# Patient Record
Sex: Male | Born: 1975 | Hispanic: Refuse to answer | Marital: Married | Smoking: Former smoker
Health system: Southern US, Community
[De-identification: ages and names within clinical notes are randomized; demographics above are authoritative.]

## PROBLEM LIST (undated history)

## (undated) DIAGNOSIS — J45909 Unspecified asthma, uncomplicated: Secondary | ICD-10-CM

## (undated) DIAGNOSIS — K219 Gastro-esophageal reflux disease without esophagitis: Secondary | ICD-10-CM

## (undated) DIAGNOSIS — A159 Respiratory tuberculosis unspecified: Secondary | ICD-10-CM

## (undated) DIAGNOSIS — G4730 Sleep apnea, unspecified: Secondary | ICD-10-CM

## (undated) DIAGNOSIS — N529 Male erectile dysfunction, unspecified: Secondary | ICD-10-CM

## (undated) HISTORY — DX: Respiratory tuberculosis unspecified: A15.9

## (undated) HISTORY — DX: Male erectile dysfunction, unspecified: N52.9

## (undated) HISTORY — DX: Gastro-esophageal reflux disease without esophagitis: K21.9

---

## 1999-07-31 ENCOUNTER — Emergency Department (HOSPITAL_COMMUNITY): Admission: EM | Admit: 1999-07-31 | Discharge: 1999-07-31 | Payer: Self-pay | Admitting: Emergency Medicine

## 2002-02-25 ENCOUNTER — Emergency Department (HOSPITAL_COMMUNITY): Admission: EM | Admit: 2002-02-25 | Discharge: 2002-02-25 | Payer: Self-pay | Admitting: Emergency Medicine

## 2003-05-27 ENCOUNTER — Ambulatory Visit (HOSPITAL_BASED_OUTPATIENT_CLINIC_OR_DEPARTMENT_OTHER): Admission: RE | Admit: 2003-05-27 | Discharge: 2003-05-27 | Payer: Self-pay | Admitting: Family Medicine

## 2004-09-28 ENCOUNTER — Emergency Department (HOSPITAL_COMMUNITY): Admission: EM | Admit: 2004-09-28 | Discharge: 2004-09-28 | Payer: Self-pay | Admitting: Emergency Medicine

## 2006-03-04 ENCOUNTER — Emergency Department (HOSPITAL_COMMUNITY): Admission: EM | Admit: 2006-03-04 | Discharge: 2006-03-04 | Payer: Self-pay | Admitting: Emergency Medicine

## 2007-01-31 ENCOUNTER — Emergency Department (HOSPITAL_COMMUNITY): Admission: EM | Admit: 2007-01-31 | Discharge: 2007-01-31 | Payer: Self-pay | Admitting: Sports Medicine

## 2007-02-11 ENCOUNTER — Emergency Department (HOSPITAL_COMMUNITY): Admission: EM | Admit: 2007-02-11 | Discharge: 2007-02-11 | Payer: Self-pay | Admitting: Emergency Medicine

## 2013-10-04 ENCOUNTER — Encounter (HOSPITAL_COMMUNITY): Payer: Self-pay | Admitting: Emergency Medicine

## 2013-10-04 ENCOUNTER — Emergency Department (HOSPITAL_COMMUNITY)
Admission: EM | Admit: 2013-10-04 | Discharge: 2013-10-04 | Disposition: A | Discharge status: Home / Self Care | Payer: Managed Care, Other (non HMO) | Attending: Emergency Medicine | Admitting: Emergency Medicine

## 2013-10-04 DIAGNOSIS — H5710 Ocular pain, unspecified eye: Secondary | ICD-10-CM | POA: Diagnosis present

## 2013-10-04 DIAGNOSIS — H16002 Unspecified corneal ulcer, left eye: Secondary | ICD-10-CM

## 2013-10-04 DIAGNOSIS — H16009 Unspecified corneal ulcer, unspecified eye: Secondary | ICD-10-CM | POA: Insufficient documentation

## 2013-10-04 HISTORY — DX: Unspecified asthma, uncomplicated: J45.909

## 2013-10-04 HISTORY — DX: Sleep apnea, unspecified: G47.30

## 2013-10-04 MED ORDER — OFLOXACIN 0.3 % OP SOLN: 2.0000 [drp] | OPHTHALMIC | Status: DC

## 2013-10-04 MED ORDER — HYDROCODONE-ACETAMINOPHEN 5-325 MG PO TABS: 1.0000 | ORAL_TABLET | Freq: Four times a day (QID) | ORAL | Status: DC | PRN

## 2013-10-04 NOTE — ED Notes (Signed)
Declined W/C at D/C and was escorted to lobby by RN. 

## 2013-10-04 NOTE — ED Provider Notes (Signed)
CSN: [id number]     Arrival date & time 10/04/13  0941 History  This chart was scribed for non-physician practitioner Ebbie Ridge, PA-C working with Layla Maw Ward, DO by Leone Payor, ED Scribe. This patient was seen in room TR10C/TR10C and the patient's care was started at 10:23 AM.    No chief complaint on file.   The history is provided by the patient. No language interpreter was used.    HPI Comments: Kyle Patton is a 38 y.o. male who presents to the Emergency Department complaining of constant, gradually worsened left eye pain and redness with associated photophobia that began last night. Patient states he wears contact lenses and states he feels as though there is something in his eye even after removing the lens. He reports seeing a yellow spot on the left eye as well. He denies discharge, fever.     No past medical history on file. No past surgical history on file. No family history on file. History  Substance Use Topics  . Smoking status: Not on file  . Smokeless tobacco: Not on file  . Alcohol Use: Not on file    Review of Systems  Constitutional: Negative for fever.  Eyes: Positive for photophobia, pain and redness.      Allergies  Review of patient's allergies indicates not on file.  Home Medications   Prior to Admission medications   Not on File   There were no vitals taken for this visit. Physical Exam  Nursing note and vitals reviewed. Constitutional: He is oriented to person, place, and time. He appears well-developed and well-nourished.  HENT:  Head: Normocephalic and atraumatic.  Eyes: EOM are normal. Pupils are equal, round, and reactive to light. Left conjunctiva is injected. Left conjunctiva has no hemorrhage. Left eye exhibits normal extraocular motion.  Slit lamp exam:      The right eye shows no anterior chamber bulge.       The left eye shows corneal ulcer and fluorescein uptake. The left eye shows no corneal abrasion, no corneal flare, no  foreign body, no hyphema, no hypopyon and no anterior chamber bulge.    Pulmonary/Chest: Effort normal.  Neurological: He is alert and oriented to person, place, and time.  Skin: Skin is warm and dry.  Psychiatric: He has a normal mood and affect.    ED Course  Procedures (including critical care time)   COORDINATION OF CARE: 10:26 AM Discussed treatment plan with pt at bedside and pt agreed to plan.  Patient has a corneal ulcer noted to the 5 o'clock position on examination. I have advised him to follow up tomorrow with the opthalmologist. Patient agrees to the plan and all questions were answered.   I personally performed the services described in this documentation, which was scribed in my presence. The recorded information has been reviewed and is accurate.    Carlyle Dolly, PA-C 10/04/13 1042

## 2013-10-04 NOTE — ED Provider Notes (Signed)
Medical screening examination/treatment/procedure(s) were performed by non-physician practitioner and as supervising physician I was immediately available for consultation/collaboration.   EKG Interpretation None        Quiara Killian N Edris Friedt, DO 10/04/13 1050 

## 2013-10-04 NOTE — ED Notes (Signed)
Pt reports eye pain started Friday.

## 2013-10-04 NOTE — Discharge Instructions (Signed)
Call the eye doctor for thing in the morning for an appointment for tomorrow. Return here as needed.

## 2014-10-14 ENCOUNTER — Emergency Department (HOSPITAL_COMMUNITY)
Admission: EM | Admit: 2014-10-14 | Discharge: 2014-10-14 | Disposition: A | Discharge status: Home / Self Care | Payer: 59 | Attending: Emergency Medicine | Admitting: Emergency Medicine

## 2014-10-14 ENCOUNTER — Encounter (HOSPITAL_COMMUNITY): Payer: Self-pay | Admitting: *Deleted

## 2014-10-14 DIAGNOSIS — K30 Functional dyspepsia: Secondary | ICD-10-CM | POA: Insufficient documentation

## 2014-10-14 DIAGNOSIS — Z87891 Personal history of nicotine dependence: Secondary | ICD-10-CM | POA: Diagnosis not present

## 2014-10-14 DIAGNOSIS — R05 Cough: Secondary | ICD-10-CM | POA: Insufficient documentation

## 2014-10-14 DIAGNOSIS — R1310 Dysphagia, unspecified: Secondary | ICD-10-CM | POA: Diagnosis not present

## 2014-10-14 DIAGNOSIS — G4730 Sleep apnea, unspecified: Secondary | ICD-10-CM | POA: Insufficient documentation

## 2014-10-14 DIAGNOSIS — Z79899 Other long term (current) drug therapy: Secondary | ICD-10-CM | POA: Diagnosis not present

## 2014-10-14 DIAGNOSIS — J45909 Unspecified asthma, uncomplicated: Secondary | ICD-10-CM | POA: Diagnosis not present

## 2014-10-14 DIAGNOSIS — J029 Acute pharyngitis, unspecified: Secondary | ICD-10-CM | POA: Diagnosis present

## 2014-10-14 MED ORDER — OMEPRAZOLE 20 MG PO CPDR: 20.0000 mg | DELAYED_RELEASE_CAPSULE | Freq: Every day | ORAL | Status: DC

## 2014-10-14 NOTE — ED Notes (Signed)
Patient given crackers and sprite to drink.  Family at bedside.

## 2014-10-14 NOTE — Discharge Instructions (Signed)
-   Take omeprazole daily - Schedule follow up appointment with [facility] - Return to ED with fevers, difficulty breathing, difficulty swallowing liquids or saliva, choking or further worsening of symptoms  You are unable to swallow your own saliva . You are having shortness of breath or a fever, or both. You have a hoarse voice along with difficulty swallowing.

## 2014-10-14 NOTE — ED Provider Notes (Signed)
CSN: [id number]     Arrival date & time 10/14/14  4098 History   First MD Initiated Contact with Patient 10/14/14 [phone]     Chief Complaint  Patient presents with  . Sore Throat    HPI  Kyle Patton is a 39 year old male with PMHx of GERD and sleep apnea presenting today with sensation of food getting stuck in his throat. He states that when he eats he feels that his food gets stuck in his throat and he has a "thick" feeling. He is able to swallow fluids and solids easily without choking or gagging. Denies difficulty breathing or shortness of breath. He also describes a feeling of gas that gets stuck in his mid-chest when he tries to belch after eating. He is also complaining of "tickling" in his throat causing cough. These symptoms have been ongoing for around 4 months but have become more frequent  He has a history of GERD but has not taking PPIs or antacids for almost 2 years. Denies fevers, chills, sore throat, choking, chest pain, abdominal pain, nausea and vomiting.   Past Medical History  Diagnosis Date  . Asthma   . Sleep apnea     PT uses C=PAP   History reviewed. No pertinent past surgical history. No family history on file. Social History  Substance Use Topics  . Smoking status: Former Smoker    Quit date: 10/14/2007  . Smokeless tobacco: Never Used  . Alcohol Use: None     Comment: social    Review of Systems  Constitutional: Negative for fever and chills.  HENT: Positive for trouble swallowing. Negative for drooling and sore throat.   Respiratory: Positive for cough. Negative for apnea, choking, shortness of breath, wheezing and stridor.   Cardiovascular: Negative for chest pain and palpitations.  Gastrointestinal: Negative for nausea, vomiting and abdominal pain.  Musculoskeletal: Negative for neck pain.  Neurological: Negative for dizziness and headaches.      Allergies  Review of patient's allergies indicates no known allergies.  Home Medications   Prior to  Admission medications   Medication Sig Start Date End Date Taking? Authorizing Provider  Multiple Vitamins-Minerals (MULTIVITAMIN WITH MINERALS) tablet Take 1 tablet by mouth daily.   Yes Historical Provider, MD  omeprazole (PRILOSEC) 20 MG capsule Take 1 capsule (20 mg total) by mouth daily. 10/14/14   Alaycia Eardley, PA-C   BP 142/78 mmHg  Pulse 77  Temp(Src) 97.8 F (36.6 C) (Oral)  Resp 17  Ht 5' 6.25" (1.683 m)  Wt 217 lb (98.431 kg)  BMI 34.75 kg/m2  SpO2 97% Physical Exam  Constitutional: He is oriented to person, place, and time. He appears well-developed and well-nourished. No distress.  HENT:  Head: Normocephalic and atraumatic.  Mouth/Throat: Uvula is midline, oropharynx is clear and moist and mucous membranes are normal. No uvula swelling. No oropharyngeal exudate, posterior oropharyngeal edema or posterior oropharyngeal erythema.  No visible food stuck in oropharynx. No swelling of oropharynx or uvula. Airway patent.   Neck: Normal range of motion. Neck supple.  No tenderness over neck.  Cardiovascular: Normal rate, regular rhythm and normal heart sounds.   No murmur heard. Pulmonary/Chest: Effort normal and breath sounds normal. No stridor. No respiratory distress. He has no wheezes. He has no rales.  No increased work of breathing. Pt sitting comfortably with unlabored breaths.   Abdominal: Soft. He exhibits no distension. There is no tenderness. There is no rebound and no guarding.  Lymphadenopathy:    He has  no cervical adenopathy.  Neurological: He is alert and oriented to person, place, and time.  Skin: Skin is warm and dry.  Psychiatric: He has a normal mood and affect. His behavior is normal.  Nursing note and vitals reviewed.   ED Course  Procedures (including critical care time) Labs Review Labs Reviewed - No data to display  Imaging Review No results found. I have personally reviewed and evaluated these images and lab results as part of my medical  decision-making.   EKG Interpretation None      MDM   Final diagnoses:  Dysphagia  Indigestion   Pt presenting with feelings of food getting stuck in his throat, feeling of "gas getting caught in his chest" and cough. These symptoms have been present for 4 months but are getting more frequent. No difficulty swallowing solids or liquids. No trouble breathing or SOB. Pt has history of GERD but has been off PPI for 2 years. VSS and pt nontoxic. Throat is clear of food debri with no edema of uvula or oropharynx. Airway patent. No respiratory distress. Lungs CTAB. No stridor or wheezing. Will restart pt on PPI and give GI referral. Pt to call and schedule an appointment later today or tomorrow. Return precautions given in discharge paperwork and discussed with pt and wife present in the room. Pt agrees with this plan.    Alveta Heimlich, PA-C 10/14/14 1901  Lorre Nick, MD 10/15/14 0730

## 2014-10-14 NOTE — ED Notes (Signed)
Pt c/o "thick" feeling to epiglottis, off and on for several months, this time lasting 4 days.  Also experiences pain to both jaws when opening mouth and gagging after he eats.

## 2015-11-02 ENCOUNTER — Ambulatory Visit
Admission: RE | Admit: 2015-11-02 | Discharge: 2015-11-02 | Disposition: A | Discharge status: Home / Self Care | Payer: No Typology Code available for payment source | Source: Ambulatory Visit

## 2015-11-02 ENCOUNTER — Other Ambulatory Visit: Payer: Self-pay

## 2015-11-02 DIAGNOSIS — R7611 Nonspecific reaction to tuberculin skin test without active tuberculosis: Secondary | ICD-10-CM

## 2016-09-01 ENCOUNTER — Emergency Department (HOSPITAL_COMMUNITY): Payer: PRIVATE HEALTH INSURANCE

## 2016-09-01 ENCOUNTER — Encounter (HOSPITAL_COMMUNITY): Payer: Self-pay | Admitting: Emergency Medicine

## 2016-09-01 ENCOUNTER — Emergency Department (HOSPITAL_COMMUNITY)
Admission: EM | Admit: 2016-09-01 | Discharge: 2016-09-01 | Disposition: A | Discharge status: Home / Self Care | Payer: PRIVATE HEALTH INSURANCE | Attending: Emergency Medicine | Admitting: Emergency Medicine

## 2016-09-01 DIAGNOSIS — Y9389 Activity, other specified: Secondary | ICD-10-CM | POA: Insufficient documentation

## 2016-09-01 DIAGNOSIS — J45909 Unspecified asthma, uncomplicated: Secondary | ICD-10-CM | POA: Insufficient documentation

## 2016-09-01 DIAGNOSIS — M25551 Pain in right hip: Secondary | ICD-10-CM

## 2016-09-01 DIAGNOSIS — M7061 Trochanteric bursitis, right hip: Secondary | ICD-10-CM | POA: Insufficient documentation

## 2016-09-01 DIAGNOSIS — Z87891 Personal history of nicotine dependence: Secondary | ICD-10-CM | POA: Insufficient documentation

## 2016-09-01 MED ORDER — TRAMADOL HCL 50 MG PO TABS
100.0000 mg | ORAL_TABLET | Freq: Once | ORAL | Status: AC
  Administered 2016-09-01: 100 mg via ORAL
  Filled 2016-09-01: qty 2

## 2016-09-01 MED ORDER — ORPHENADRINE CITRATE ER 100 MG PO TB12: 100.0000 mg | ORAL_TABLET | Freq: Two times a day (BID) | ORAL | 0 refills | Status: DC

## 2016-09-01 MED ORDER — TRAMADOL HCL 50 MG PO TABS: 100.0000 mg | ORAL_TABLET | Freq: Four times a day (QID) | ORAL | 0 refills | Status: DC | PRN

## 2016-09-01 MED ORDER — PREDNISONE 20 MG PO TABS: ORAL_TABLET | ORAL | 0 refills | Status: DC

## 2016-09-01 MED ORDER — ACETAMINOPHEN 500 MG PO TABS
1000.0000 mg | ORAL_TABLET | Freq: Once | ORAL | Status: AC
  Administered 2016-09-01: 1000 mg via ORAL
  Filled 2016-09-01: qty 2

## 2016-09-01 NOTE — ED Provider Notes (Signed)
[facility]-EMERGENCY DEPT Provider Note   CSN: [id number] Arrival date & time: 09/01/16  0911     History   Chief Complaint Chief Complaint  Patient presents with  . Hip Pain    HPI Kyle Patton is a 41 y.o. male.  HPI Patient reports he has been getting right hip pain for about a week. It has been gradual in onset. He reports that is now very painful if he rotates or moves his hip a certain direction. The pain is right on the outside aspect of the right hip. No specific injury but he did start a new job that requires him to get in and out of a [employer] multiple times per day. No associated back pain. No associated lower extremity weakness numbness or tingling. He has been trying Aleve and ibuprofen with some relief. Past Medical History:  Diagnosis Date  . Asthma   . Sleep apnea    PT uses C=PAP    There are no active problems to display for this patient.   History reviewed. No pertinent surgical history.     Home Medications    Prior to Admission medications   Medication Sig Start Date End Date Taking? Authorizing Provider  acetaminophen (TYLENOL) 500 MG tablet Take 500 mg by mouth every 6 (six) hours as needed for mild pain.   Yes [provider]  naproxen sodium (ANAPROX) 220 MG tablet Take 220 mg by mouth daily as needed (pain).   Yes [provider]  omeprazole (PRILOSEC) 20 MG capsule Take 1 capsule (20 mg total) by mouth daily. Patient not taking: Reported on 09/01/2016 10/14/14   Barrett, Rolm GalaStevi, PA-C  orphenadrine (NORFLEX) 100 MG tablet Take 1 tablet (100 mg total) by mouth 2 (two) times daily. 09/01/16   Arby BarrettePfeiffer, Ornella Coderre, MD  predniSONE (DELTASONE) 20 MG tablet 3 tabs po daily x 3 days, then 2 tabs x 3 days, then 1.5 tabs x 3 days, then 1 tab x 3 days, then 0.5 tabs x 3 days 09/01/16   Arby BarrettePfeiffer, Jasraj Lappe, MD  traMADol (ULTRAM) 50 MG tablet Take 2 tablets (100 mg total) by mouth every 6 (six) hours as needed. 09/01/16   Arby BarrettePfeiffer, Kohner Orlick, MD    Family  History No family history on file.  Social History Social History  Substance Use Topics  . Smoking status: Former Smoker    Quit date: 10/14/2007  . Smokeless tobacco: Never Used  . Alcohol use Not on file     Comment: social     Allergies   Patient has no known allergies.   Review of Systems Review of Systems 10 Systems reviewed and are negative for acute change except as noted in the HPI.   Physical Exam Updated Vital Signs BP 130/82   Pulse 61   Temp 98.7 F (37.1 C) (Oral)   Resp 18   Ht 5\' 6"  (1.676 m)   Wt 90.7 kg (200 lb)   SpO2 98%   BMI 32.28 kg/m   Physical Exam  Constitutional: He appears well-developed and well-nourished.  HENT:  Head: Normocephalic and atraumatic.  Eyes: EOM are normal.  Neck: Neck supple.  Cardiovascular: Normal rate, regular rhythm and normal heart sounds.   No murmur heard. Pulmonary/Chest: Effort normal and breath sounds normal. No respiratory distress.  Abdominal: Soft. He exhibits no distension. There is no tenderness. There is no guarding.  Musculoskeletal: He exhibits tenderness. He exhibits no edema.  Low back is nontender to palpation. Buttocks and sciatic outlet nontender.  Neurological: He is alert.  Skin: Skin is warm and dry.  Psychiatric: He has a normal mood and affect.  Nursing note and vitals reviewed.    ED Treatments / Results  Labs (all labs ordered are listed, but only abnormal results are displayed) Labs Reviewed - No data to display  EKG  EKG Interpretation None       Radiology Dg Hip Unilat With Pelvis 2-3 Views Right  Result Date: 09/01/2016 CLINICAL DATA:  Right hip pain for 1 week. EXAM: DG HIP (WITH OR WITHOUT PELVIS) 2-3V RIGHT COMPARISON:  None. FINDINGS: Asymmetric calcification around the right greater trochanter. No fracture or malalignment. No erosive changes or degenerative joint narrowing/spurring. IMPRESSION: 1. Asymmetric calcification around the right greater trochanter which  could be calcific tendinitis or enthesophytes. 2. No acute osseous finding or degenerative joint narrowing. Electronically Signed   By: Marnee Spring M.D.   On: 09/01/2016 11:15    Procedures Procedures (including critical care time)  Medications Ordered in ED Medications  acetaminophen (TYLENOL) tablet 1,000 mg (1,000 mg Oral Given 09/01/16 1046)  traMADol (ULTRAM) tablet 100 mg (100 mg Oral Given 09/01/16 1046)     Initial Impression / Assessment and Plan / ED Course  I have reviewed the triage vital signs and the nursing notes.  Pertinent labs & imaging results that were available during my care of the patient were reviewed by me and considered in my medical decision making (see chart for details).      Final Clinical Impressions(s) / ED Diagnoses   Final diagnoses:  Right hip pain  Tendinitis in region of greater trochanter of femur, right  Patient has focal hip pain with calcific tendinitis identified on x-ray. This is likely overuse related as he has started a new job with recurrent in and out from a [place]. Will initiate prednisone taper, pain medications and muscle relaxers with instructions for follow-up and icing and rest.  New Prescriptions Discharge Medication List as of 09/01/2016 11:56 AM    START taking these medications   Details  orphenadrine (NORFLEX) 100 MG tablet Take 1 tablet (100 mg total) by mouth 2 (two) times daily., Starting Sat 09/01/2016, Print    predniSONE (DELTASONE) 20 MG tablet 3 tabs po daily x 3 days, then 2 tabs x 3 days, then 1.5 tabs x 3 days, then 1 tab x 3 days, then 0.5 tabs x 3 days, Print    traMADol (ULTRAM) 50 MG tablet Take 2 tablets (100 mg total) by mouth every 6 (six) hours as needed., Starting Sat 09/01/2016, Print         Arby Barrette, MD 09/01/16 1723

## 2016-09-01 NOTE — ED Triage Notes (Signed)
Patient presents from home with complaints of Right Hip pain x7. Patient reports pain has become increasing worst over the week. Patient reports taking naproxen and tylenol and reports helps. Patient denies any trauma to the hip reports he gets in and out of the work [employer]. Patient alert and oriented. Patient ambulatory

## 2017-03-13 ENCOUNTER — Emergency Department (HOSPITAL_COMMUNITY)
Admission: EM | Admit: 2017-03-13 | Discharge: 2017-03-13 | Disposition: A | Discharge status: Home / Self Care | Payer: BLUE CROSS/BLUE SHIELD | Attending: Emergency Medicine | Admitting: Emergency Medicine

## 2017-03-13 ENCOUNTER — Encounter (HOSPITAL_COMMUNITY): Payer: Self-pay | Admitting: *Deleted

## 2017-03-13 ENCOUNTER — Other Ambulatory Visit: Payer: Self-pay

## 2017-03-13 DIAGNOSIS — M546 Pain in thoracic spine: Secondary | ICD-10-CM | POA: Insufficient documentation

## 2017-03-13 DIAGNOSIS — G8929 Other chronic pain: Secondary | ICD-10-CM

## 2017-03-13 DIAGNOSIS — J45909 Unspecified asthma, uncomplicated: Secondary | ICD-10-CM | POA: Insufficient documentation

## 2017-03-13 DIAGNOSIS — Z87891 Personal history of nicotine dependence: Secondary | ICD-10-CM | POA: Insufficient documentation

## 2017-03-13 DIAGNOSIS — M7711 Lateral epicondylitis, right elbow: Secondary | ICD-10-CM

## 2017-03-13 NOTE — Discharge Instructions (Signed)
Please read attached information. If you experience any new or worsening signs or symptoms please return to the emergency room for evaluation. Please follow-up with your primary care provider or specialist as discussed.  °

## 2017-03-13 NOTE — ED Triage Notes (Signed)
Pt reports having right shoulder discomfort for several months. Reports recently having nerve pain to right elbow and forearm, states it increases with movement and gripping objects. Ambulatory at triage and no acute distress is noted.

## 2017-03-13 NOTE — ED Provider Notes (Signed)
[facility] EMERGENCY DEPARTMENT Provider Note   CSN: [id number] Arrival date & time: 03/13/17  1322     History   Chief Complaint Chief Complaint  Patient presents with  . Arm Pain    HPI Kyle Patton is a 42 y.o. male.  HPI   42 year old male presents today with complaints of arm pain.  Patient notes 2 weeks of right lateral elbow pain.  He notes pain is worsened with flexion or extension of the elbow, also with pronation and supination of the forearm.  He reports that he is currently not working, does not exercise, does not do any repetitive activities.  He denies any trauma or swelling to the area.  No loss of distal sensation strength or motor function.  Patient also notes intermittent upper thoracic back pain, worse after waking up in the morning, no distal neurological deficits.  Patient denies any red flags for back pain.   Past Medical History:  Diagnosis Date  . Asthma   . Sleep apnea    PT uses C=PAP    There are no active problems to display for this patient.   History reviewed. No pertinent surgical history.     Home Medications    Prior to Admission medications   Medication Sig Start Date End Date Taking? Authorizing Provider  acetaminophen (TYLENOL) 500 MG tablet Take 500 mg by mouth every 6 (six) hours as needed for mild pain.   Yes [provider]  naproxen sodium (ANAPROX) 220 MG tablet Take 220 mg by mouth daily as needed (pain).   Yes [provider]  omeprazole (PRILOSEC) 20 MG capsule Take 1 capsule (20 mg total) by mouth daily. Patient taking differently: Take 20 mg by mouth daily as needed (reflux).  10/14/14  Yes Barrett, Rolm Gala, PA-C  orphenadrine (NORFLEX) 100 MG tablet Take 1 tablet (100 mg total) by mouth 2 (two) times daily. Patient not taking: Reported on 03/13/2017 09/01/16   Arby Barrette, MD  traMADol (ULTRAM) 50 MG tablet Take 2 tablets (100 mg total) by mouth every 6 (six) hours as  needed. Patient not taking: Reported on 03/13/2017 09/01/16   Arby Barrette, MD    Family History History reviewed. No pertinent family history.  Social History Social History   Tobacco Use  . Smoking status: Former Smoker    Last attempt to quit: 10/14/2007    Years since quitting: 9.4  . Smokeless tobacco: Never Used  Substance Use Topics  . Alcohol use: Not on file    Comment: social  . Drug use: No     Allergies   Patient has no known allergies.   Review of Systems Review of Systems  All other systems reviewed and are negative.   Physical Exam Updated Vital Signs BP 132/80 (BP Location: Left Arm)   Pulse 75   Temp 98.7 F (37.1 C) (Oral)   Resp 20   SpO2 97%   Physical Exam  Constitutional: He is oriented to person, place, and time. He appears well-developed and well-nourished.  HENT:  Head: Normocephalic and atraumatic.  Eyes: Conjunctivae are normal. Pupils are equal, round, and reactive to light. Right eye exhibits no discharge. Left eye exhibits no discharge. No scleral icterus.  Neck: Normal range of motion. No JVD present. No tracheal deviation present.  Pulmonary/Chest: Effort normal. No stridor.  Musculoskeletal:  Right upper extremity atraumatic no swelling or edema, tenderness palpation of the lateral epicondyle of the humerus.  Pain with pronation supination of the  forearm and wrist, pain with extension of the elbow-grip strength 5 out of 5 sensation intact radial pulse 2+  No CT or L-spine tenderness, musculature of back nontender no swelling or edema  Neurological: He is alert and oriented to person, place, and time. Coordination normal.  Psychiatric: He has a normal mood and affect. His behavior is normal. Judgment and thought content normal.  Nursing note and vitals reviewed.    ED Treatments / Results  Labs (all labs ordered are listed, but only abnormal results are displayed) Labs Reviewed - No data to display  EKG  EKG  Interpretation None       Radiology No results found.  Procedures Procedures (including critical care time)  Medications Ordered in ED Medications - No data to display   Initial Impression / Assessment and Plan / ED Course  I have reviewed the triage vital signs and the nursing notes.  Pertinent labs & imaging results that were available during my care of the patient were reviewed by me and considered in my medical decision making (see chart for details).     Final Clinical Impressions(s) / ED Diagnoses   Final diagnoses:  Lateral epicondylitis of right elbow  Chronic midline thoracic back pain   Labs:   Imaging:  Consults:  Therapeutics:  Discharge Meds:   Assessment/Plan: 42 year old male presents today with likely right lateral epicondylitis.  Uncertain etiology at this time as patient does not do any significant physical activity or repetitive behaviors.  Patient will be instructed to rest ice compress and avoid any significant physical activity.  I have encouraged him to follow-up as an outpatient with his primary care if symptoms persist.  No red flags for back pain back exercises given.  He is given strict return precautions, he verbalized understanding and agreement to today's plan had no further questions or concerns.    ED Discharge Orders    None       Eyvonne MechanicHedges, Loza Prell, PA-C 03/13/17 1715    Rolan BuccoBelfi, Melanie, MD 03/15/17 [phone]

## 2017-07-30 ENCOUNTER — Other Ambulatory Visit: Payer: Self-pay

## 2017-07-30 ENCOUNTER — Emergency Department (HOSPITAL_COMMUNITY)
Admission: EM | Admit: 2017-07-30 | Discharge: 2017-07-30 | Disposition: A | Discharge status: Home / Self Care | Payer: BLUE CROSS/BLUE SHIELD | Attending: Emergency Medicine | Admitting: Emergency Medicine

## 2017-07-30 ENCOUNTER — Encounter (HOSPITAL_COMMUNITY): Payer: Self-pay | Admitting: Emergency Medicine

## 2017-07-30 DIAGNOSIS — Z87891 Personal history of nicotine dependence: Secondary | ICD-10-CM | POA: Diagnosis not present

## 2017-07-30 DIAGNOSIS — G44209 Tension-type headache, unspecified, not intractable: Secondary | ICD-10-CM | POA: Diagnosis not present

## 2017-07-30 DIAGNOSIS — J45909 Unspecified asthma, uncomplicated: Secondary | ICD-10-CM | POA: Diagnosis not present

## 2017-07-30 DIAGNOSIS — G44219 Episodic tension-type headache, not intractable: Secondary | ICD-10-CM | POA: Insufficient documentation

## 2017-07-30 DIAGNOSIS — R51 Headache: Secondary | ICD-10-CM | POA: Diagnosis not present

## 2017-07-30 LAB — CBG MONITORING, ED: GLUCOSE-CAPILLARY: 84 mg/dL (ref 65–99)

## 2017-07-30 MED ORDER — DIPHENHYDRAMINE HCL 25 MG PO CAPS
25.0000 mg | ORAL_CAPSULE | Freq: Once | ORAL | Status: AC
  Administered 2017-07-30: 25 mg via ORAL
  Filled 2017-07-30: qty 1

## 2017-07-30 MED ORDER — METOCLOPRAMIDE HCL 5 MG/ML IJ SOLN: 5.0000 mg | Freq: Once | INTRAMUSCULAR | Status: DC

## 2017-07-30 MED ORDER — METOCLOPRAMIDE HCL 10 MG PO TABS
5.0000 mg | ORAL_TABLET | Freq: Once | ORAL | Status: DC
  Filled 2017-07-30: qty 1

## 2017-07-30 MED ORDER — DIPHENHYDRAMINE HCL 50 MG/ML IJ SOLN: 25.0000 mg | Freq: Once | INTRAMUSCULAR | Status: DC

## 2017-07-30 MED ORDER — KETOROLAC TROMETHAMINE 30 MG/ML IJ SOLN
30.0000 mg | Freq: Once | INTRAMUSCULAR | Status: DC
  Filled 2017-07-30: qty 1

## 2017-07-30 NOTE — Discharge Instructions (Addendum)
Take over-the-counter anti-inflammatories for pain as needed. Call your primary care physician to schedule follow-up as planned.  Contact a health care provider if: Your symptoms are not helped by medicine. You have a headache that is different from the usual headache. You have nausea or you vomit. You have a fever. Get help right away if: Your headache becomes severe. You have repeated vomiting. You have a stiff neck. You have a loss of vision. You have problems with speech. You have pain in the eye or ear. You have muscular weakness or loss of muscle control. You lose your balance or have trouble walking. You feel faint or pass out. You have confusion.

## 2017-07-30 NOTE — ED Provider Notes (Signed)
[facility] EMERGENCY DEPARTMENT Provider Note   CSN: [id number] Arrival date & time: 07/30/17  1712     History   Chief Complaint Chief Complaint  Patient presents with  . Headache    HPI Kyle Patton is a 42 y.o. male presenting for 3 days of headache.  Patient states that his headache is an intermittent throbbing pain that occurs approximately 3-4 times a day and lasts about 1 hour.  Patient states that pain is worsened by movement and light.  Patient has tried Sudafed and Motrin for pain with no relief.  Patient states that pain is 5/10 in severity.  Patient states that he gets headaches often and this feels like his normal headache.  Patient states he is concerned about his blood sugar and blood pressure since he has a family history of diabetes and hypertension and that this is what brought him in today.  Patient has primary care provider but states that he has not seen him this year.  HPI  Past Medical History:  Diagnosis Date  . Asthma   . Sleep apnea    PT uses C=PAP    There are no active problems to display for this patient.   History reviewed. No pertinent surgical history.      Home Medications    Prior to Admission medications   Medication Sig Start Date End Date Taking? Authorizing Provider  acetaminophen (TYLENOL) 500 MG tablet Take 500 mg by mouth every 6 (six) hours as needed for mild pain.    [provider]  naproxen sodium (ANAPROX) 220 MG tablet Take 220 mg by mouth daily as needed (pain).    [provider]  omeprazole (PRILOSEC) 20 MG capsule Take 1 capsule (20 mg total) by mouth daily. Patient taking differently: Take 20 mg by mouth daily as needed (reflux).  10/14/14   Barrett, Rolm Gala, PA-C  orphenadrine (NORFLEX) 100 MG tablet Take 1 tablet (100 mg total) by mouth 2 (two) times daily. Patient not taking: Reported on 03/13/2017 09/01/16   Arby Barrette, MD  traMADol (ULTRAM) 50 MG tablet Take 2 tablets (100  mg total) by mouth every 6 (six) hours as needed. Patient not taking: Reported on 03/13/2017 09/01/16   Arby Barrette, MD    Family History No family history on file.  Social History Social History   Tobacco Use  . Smoking status: Former Smoker    Last attempt to quit: 10/14/2007    Years since quitting: 9.8  . Smokeless tobacco: Never Used  Substance Use Topics  . Alcohol use: Not on file    Comment: social  . Drug use: No     Allergies   Patient has no known allergies.   Review of Systems Review of Systems  Constitutional: Negative.  Negative for chills, fatigue and fever.  HENT: Positive for sinus pressure. Negative for congestion, ear pain, rhinorrhea, sore throat and trouble swallowing.   Eyes: Positive for photophobia. Negative for pain, redness and visual disturbance.  Respiratory: Negative.  Negative for cough, chest tightness, shortness of breath and wheezing.   Cardiovascular: Negative.  Negative for chest pain and leg swelling.  Gastrointestinal: Negative.  Negative for abdominal pain, blood in stool, diarrhea, nausea and vomiting.  Genitourinary: Negative.  Negative for dysuria, flank pain, hematuria, scrotal swelling and testicular pain.  Musculoskeletal: Negative.  Negative for arthralgias, myalgias and neck pain.  Skin: Negative.  Negative for rash.  Neurological: Positive for headaches. Negative for dizziness, syncope, speech difficulty, weakness,  light-headedness and numbness.     Physical Exam Updated Vital Signs BP (!) 148/95 (BP Location: Right Arm)   Pulse 62   Temp 98.5 F (36.9 C) (Oral)   Resp 18   Ht 5\' 6"  (1.676 m)   Wt 98.4 kg (217 lb)   SpO2 99%   BMI 35.02 kg/m   Physical Exam  Constitutional: He is oriented to person, place, and time. He appears well-developed and well-nourished.  Non-toxic appearance. He does not appear ill. No distress.  HENT:  Head: Normocephalic and atraumatic.  Mouth/Throat: Oropharynx is clear and moist.    Eyes: Pupils are equal, round, and reactive to light. EOM are normal. Pupils are equal.  Neck: Trachea normal, normal range of motion, full passive range of motion without pain and phonation normal. Neck supple. No muscular tenderness present. No neck rigidity. No tracheal deviation and normal range of motion present.  Cardiovascular: Normal rate, regular rhythm, normal heart sounds and intact distal pulses. Exam reveals no gallop and no friction rub.  No murmur heard. Pulmonary/Chest: Effort normal and breath sounds normal. No respiratory distress.  Abdominal: Soft. Bowel sounds are normal. There is no tenderness. There is no guarding.  Neurological: He is alert and oriented to person, place, and time. He has normal strength. No cranial nerve deficit. Coordination and gait normal.  Skin: Skin is warm and dry.  Psychiatric: He has a normal mood and affect. His behavior is normal.     ED Treatments / Results  Labs (all labs ordered are listed, but only abnormal results are displayed) Labs Reviewed  CBG MONITORING, ED    EKG None  Radiology No results found.  Procedures Procedures (including critical care time)  Medications Ordered in ED Medications  ketorolac (TORADOL) 30 MG/ML injection 30 mg (30 mg Intramuscular Refused 07/30/17 2022)  metoCLOPramide (REGLAN) tablet 5 mg (5 mg Oral Refused 07/30/17 2022)  diphenhydrAMINE (BENADRYL) capsule 25 mg (25 mg Oral Given 07/30/17 2022)     Initial Impression / Assessment and Plan / ED Course  I have reviewed the triage vital signs and the nursing notes.  Pertinent labs & imaging results that were available during my care of the patient were reviewed by me and considered in my medical decision making (see chart for details).  Clinical Course as of Jul 31 2150  Tue Jul 30, 2017  1946 Patient and visitor requesting handout for Toradol prior to administration.  Handout given and risks and benefits of Toradol injection discussed at  length with patient and family.  Patient states that he has no history of kidney disease or GI bleeding.   [BM]  2144 Patient refused Reglan and Toradol due to side effects that they read on Internet and and handout.  Patient states that he is feeling better now and requesting discharge.   [BM]  2144 Benadryl states he is feeling much better at this time.   [BM]    Clinical Course User Index [BM] Elizabeth Palau    Kyle Patton is a 42 y.o. male who presents to ED for 3 days of intermittent headache.  Patient states that this headache feels like his normal headache and that he is concerned about his blood sugar.  Patient has no history of diabetes.  No focal neuro deficits on exam.  Patient refused Toradol and Reglan today stating concerns over side effects.  Patient did take his Benadryl in department.  Patient states that his pain has now decreased to a 2.5/10  and is now requesting discharge.  On re-evaluation, patient is sitting comfortably in arm chair and asking to be discharged. The patient denies any neurologic symptoms such as visual changes, focal numbness/weakness, balance problems, confusion, or speech difficulty to suggest a life-threatening intracranial process such as intracranial hemorrhage or mass. The patient has no clotting risk factors thus venous sinus thrombosis is unlikely. No fevers, neck pain or nuchal rigidity to suggest meningitis. I feel that the patient is safe for discharge home at this time.  Patient states that he will call his primary care provider in the morning.  I have reviewed return precautions including development of neurologic symptoms, confusion, lethargy, difficulty speaking, or new/worsening/concerning symptoms.  Patient informed that he can take over-the-counter anti-inflammatory medications for his pain.  Patient states that he will follow-up with primary care provider in the morning.  Patient states understanding of return precautions.  Patient  given physical copy of return precautions in the after visit summary.  All questions answered.  Final Clinical Impressions(s) / ED Diagnoses   Final diagnoses:  Episodic tension-type headache, not intractable    ED Discharge Orders    None       Elizabeth PalauMorelli, Tazaria Dlugosz A, PA-C 07/30/17 2155    Tilden Fossaees, Elizabeth, MD 07/31/17 1317

## 2017-07-30 NOTE — ED Notes (Signed)
Pt alert and oriented in NAD. Pt verbalized understanding of discharge instructions. 

## 2017-07-30 NOTE — ED Triage Notes (Signed)
Pt complains of sinus pressure and headache off and on for two weeks. Denies N/V/diarrhea. Pt also complains of nonproductive cough.

## 2018-01-13 ENCOUNTER — Telehealth: Payer: Self-pay | Admitting: Family Medicine

## 2018-01-13 NOTE — Telephone Encounter (Signed)
Received requested records from [facility] [place]. Sending back for review.  °

## 2018-01-31 ENCOUNTER — Encounter: Payer: Self-pay | Admitting: Family Medicine

## 2018-01-31 ENCOUNTER — Ambulatory Visit: Payer: BLUE CROSS/BLUE SHIELD | Admitting: Family Medicine

## 2018-01-31 VITALS — BP 140/90 | HR 68 | Ht 66.75 in | Wt 232.2 lb

## 2018-01-31 DIAGNOSIS — Z113 Encounter for screening for infections with a predominantly sexual mode of transmission: Secondary | ICD-10-CM | POA: Diagnosis not present

## 2018-01-31 DIAGNOSIS — E669 Obesity, unspecified: Secondary | ICD-10-CM | POA: Diagnosis not present

## 2018-01-31 DIAGNOSIS — R6882 Decreased libido: Secondary | ICD-10-CM

## 2018-01-31 DIAGNOSIS — G4733 Obstructive sleep apnea (adult) (pediatric): Secondary | ICD-10-CM | POA: Diagnosis not present

## 2018-01-31 DIAGNOSIS — Z0000 Encounter for general adult medical examination without abnormal findings: Secondary | ICD-10-CM | POA: Diagnosis not present

## 2018-01-31 DIAGNOSIS — Z23 Encounter for immunization: Secondary | ICD-10-CM | POA: Diagnosis not present

## 2018-01-31 DIAGNOSIS — K219 Gastro-esophageal reflux disease without esophagitis: Secondary | ICD-10-CM | POA: Diagnosis not present

## 2018-01-31 DIAGNOSIS — A159 Respiratory tuberculosis unspecified: Secondary | ICD-10-CM | POA: Insufficient documentation

## 2018-01-31 DIAGNOSIS — R5383 Other fatigue: Secondary | ICD-10-CM

## 2018-01-31 DIAGNOSIS — Z13220 Encounter for screening for lipoid disorders: Secondary | ICD-10-CM

## 2018-01-31 DIAGNOSIS — Z833 Family history of diabetes mellitus: Secondary | ICD-10-CM | POA: Insufficient documentation

## 2018-01-31 DIAGNOSIS — G4730 Sleep apnea, unspecified: Secondary | ICD-10-CM | POA: Insufficient documentation

## 2018-01-31 HISTORY — DX: Obesity, unspecified: E66.9

## 2018-01-31 LAB — POCT URINALYSIS DIP (PROADVANTAGE DEVICE)
Bilirubin, UA: NEGATIVE
GLUCOSE UA: NEGATIVE mg/dL
Ketones, POC UA: NEGATIVE mg/dL
Leukocytes, UA: NEGATIVE
NITRITE UA: NEGATIVE
PH UA: 6.5 (ref 5.0–8.0)
PROTEIN UA: NEGATIVE mg/dL
RBC UA: NEGATIVE
SPECIFIC GRAVITY, URINE: 1.015
UUROB: NEGATIVE

## 2018-01-31 NOTE — Patient Instructions (Signed)
You will receive a call to schedule the sleep study.   I recommend you get a dental cleaning.   Make sure you are cutting back on foods high in sodium. Do not sit for long periods, the calf muscle is the pump to get rid of lower edema swelling.   We will call you with your lab results.      Preventative Care for Adults, Male       REGULAR HEALTH EXAMS:  A routine yearly physical is a good way to check in with your primary care provider about your health and preventive screening. It is also an opportunity to share updates about your health and any concerns you have, and receive a thorough all-over exam.   Most health insurance companies pay for at least some preventative services.  Check with your health plan for specific coverages.  WHAT PREVENTATIVE SERVICES DO MEN NEED?  Adult men should have their weight and blood pressure checked regularly.   Men age 43 and older should have their cholesterol levels checked regularly.  Beginning at age 43 and continuing to age 43, men should be screened for colorectal cancer.  Certain people should may need continued testing until age 43.  Other cancer screening may include exams for testicular and prostate cancer.  Updating vaccinations is part of preventative care.  Vaccinations help protect against diseases such as the flu.  Lab tests are generally done as part of preventative care to screen for anemia and blood disorders, to screen for problems with the kidneys and liver, to screen for bladder problems, to check blood sugar, and to check your cholesterol level.  Preventative services generally include counseling about diet, exercise, avoiding tobacco, drugs, excessive alcohol consumption, and sexually transmitted infections.    GENERAL RECOMMENDATIONS FOR GOOD HEALTH:  Healthy diet:  Eat a variety of foods, including fruit, vegetables, animal or vegetable protein, such as meat, fish, chicken, and eggs, or beans, lentils, tofu, and  grains, such as rice.  Drink plenty of water daily.  Decrease saturated fat in the diet, avoid lots of red meat, processed foods, sweets, fast foods, and fried foods.  Exercise:  Aerobic exercise helps maintain good heart health. At least 30-40 minutes of moderate-intensity exercise is recommended. For example, a brisk walk that increases your heart rate and breathing. This should be done on most days of the week.   Find a type of exercise or a variety of exercises that you enjoy so that it becomes a part of your daily life.  Examples are running, walking, swimming, water aerobics, and biking.  For motivation and support, explore group exercise such as aerobic class, spin class, Zumba, Yoga,or  martial arts, etc.    Set exercise goals for yourself, such as a certain weight goal, walk or run in a race such as a 5k walk/run.  Speak to your primary care provider about exercise goals.  Disease prevention:  If you smoke or chew tobacco, find out from your caregiver how to quit. It can literally save your life, no matter how long you have been a tobacco user. If you do not use tobacco, never begin.   Maintain a healthy diet and normal weight. Increased weight leads to problems with blood pressure and diabetes.   The Body Mass Index or BMI is a way of measuring how much of your body is fat. Having a BMI above 27 increases the risk of heart disease, diabetes, hypertension, stroke and other problems related to obesity. Your caregiver  can help determine your BMI and based on it develop an exercise and dietary program to help you achieve or maintain this important measurement at a healthful level.  High blood pressure causes heart and blood vessel problems.  Persistent high blood pressure should be treated with medicine if weight loss and exercise do not work.   Fat and cholesterol leaves deposits in your arteries that can block them. This causes heart disease and vessel disease elsewhere in your body.   If your cholesterol is found to be high, or if you have heart disease or certain other medical conditions, then you may need to have your cholesterol monitored frequently and be treated with medication.   Ask if you should have a stress test if your history suggests this. A stress test is a test done on a treadmill that looks for heart disease. This test can find disease prior to there being a problem.  Avoid drinking alcohol in excess (more than two drinks per day).  Avoid use of street drugs. Do not share needles with anyone. Ask for professional help if you need assistance or instructions on stopping the use of alcohol, cigarettes, and/or drugs.  Brush your teeth twice a day with fluoride toothpaste, and floss once a day. Good oral hygiene prevents tooth decay and gum disease. The problems can be painful, unattractive, and can cause other health problems. Visit your dentist for a routine oral and dental check up and preventive care every 6-12 months.   Look at your skin regularly.  Use a mirror to look at your back. Notify your caregivers of changes in moles, especially if there are changes in shapes, colors, a size larger than a pencil eraser, an irregular border, or development of new moles.  Safety:  Use seatbelts 100% of the time, whether driving or as a passenger.  Use safety devices such as hearing protection if you work in environments with loud noise or significant background noise.  Use safety glasses when doing any work that could send debris in to the eyes.  Use a helmet if you ride a bike or motorcycle.  Use appropriate safety gear for contact sports.  Talk to your caregiver about gun safety.  Use sunscreen with a SPF (or skin protection factor) of 15 or greater.  Lighter skinned people are at a greater risk of skin cancer. Don't forget to also wear sunglasses in order to protect your eyes from too much damaging sunlight. Damaging sunlight can accelerate cataract formation.   Practice  safe sex. Use condoms. Condoms are used for birth control and to help reduce the spread of sexually transmitted infections (or STIs).  Some of the STIs are gonorrhea (the clap), chlamydia, syphilis, trichomonas, herpes, HPV (human papilloma virus) and HIV (human immunodeficiency virus) which causes AIDS. The herpes, HIV and HPV are viral illnesses that have no cure. These can result in disability, cancer and death.   Keep carbon monoxide and smoke detectors in your home functioning at all times. Change the batteries every 6 months or use a model that plugs into the wall.   Vaccinations:  Stay up to date with your tetanus shots and other required immunizations. You should have a booster for tetanus every 10 years. Be sure to get your flu shot every year, since 5%-20% of the [place] population comes down with the flu. The flu vaccine changes each year, so being vaccinated once is not enough. Get your shot in the fall, before the flu season peaks.  Other vaccines to consider:  Pneumococcal vaccine to protect against certain types of pneumonia.  This is normally recommended for adults age 35 or older.  However, adults younger than 43 years old with certain underlying conditions such as diabetes, heart or lung disease should also receive the vaccine.  Shingles vaccine to protect against Varicella Zoster if you are older than age 42, or younger than 43 years old with certain underlying illness.  Hepatitis A vaccine to protect against a form of infection of the liver by a virus acquired from food.  Hepatitis B vaccine to protect against a form of infection of the liver by a virus acquired from blood or body fluids, particularly if you work in health care.  If you plan to travel internationally, check with your local health department for specific vaccination recommendations.  Cancer Screening:  Most routine colon cancer screening begins at the age of 70. On a yearly basis, doctors may provide special  easy to use take-home tests to check for hidden blood in the stool. Sigmoidoscopy or colonoscopy can detect the earliest forms of colon cancer and is life saving. These tests use a small camera at the end of a tube to directly examine the colon. Speak to your caregiver about this at age 75, when routine screening begins (and is repeated every 5 years unless early forms of pre-cancerous polyps or small growths are found).   At the age of 23 men usually start screening for prostate cancer every year. Screening may begin at a younger age for those with higher risk. Those at higher risk include African-Americans or having a family history of prostate cancer. There are two types of tests for prostate cancer:   Prostate-specific antigen (PSA) testing. Recent studies raise questions about prostate cancer using PSA and you should discuss this with your caregiver.   Digital rectal exam (in which your doctor's lubricated and gloved finger feels for enlargement of the prostate through the anus).   Screening for testicular cancer.  Do a monthly exam of your testicles. Gently roll each testicle between your thumb and fingers, feeling for any abnormal lumps. The best time to do this is after a hot shower or bath when the tissues are looser. Notify your caregivers of any lumps, tenderness or changes in size or shape immediately.

## 2018-01-31 NOTE — Progress Notes (Signed)
Subjective:    Patient ID: Kyle Patton, male    DOB: [birth year], 42 y.o.   MRN: [id number]  HPI Chief Complaint  Patient presents with  . new pt    new pt cpe, acid reflux, got eye checked recently. declines flu shot. when getting up, sometimes having like panic attack.- coughing spell - has a hx of sleep apnea    He is new to the practice and here for a complete physical exam. Previous medical care: [facility]. Dr. Nehemiah SettlePolite. Last there 2 years ago.  Last CPE: 2 years ago   Other providers: None   Asthma as a child. No longer an issue.  Sleep Apnea- diagnosed in 2008. Sleep study done. Does not use his CPAP. Needs a new sleep study.   GERD- managing with lifestyle and trigger avoidance.  Complain of fatigue and decreased for several months. Requests testosterone check.   Social history: Lives with his wife and 2 kids, works as a Sports coachcase manager at [employer]  Former smoker. Stopped 10 years ago. drinkins alcohol, no drug use Diet: fried foods. Fast food 3 times per week.  Drinks green tea. Stopped drinking coffee.  Exercise: sporadic  Immunizations: flu shot declined. Tdap   Health maintenance:  Colonoscopy: N/A  Last PSA: N/A Last Dental Exam: 3 years ago  Last Eye Exam: a week ago   Wears seatbelt always, smoke detectors in home and functioning, does not text while driving, feels safe in home environment.  Reviewed allergies, medications, past medical, surgical, family, and social history.    Review of Systems Review of Systems Constitutional: -fever, -chills, -sweats, -unexpected weight change,+fatigue ENT: -runny nose, -ear pain, -sore throat Cardiology:  -chest pain, -palpitations, -edema Respiratory: -cough, -shortness of breath, -wheezing Gastroenterology: -abdominal pain, -nausea, -vomiting, -diarrhea, -constipation  Hematology: -bleeding or bruising problems Musculoskeletal: -arthralgias, -myalgias, -joint swelling, -back pain Ophthalmology:  -vision changes Urology: -dysuria, -difficulty urinating, -hematuria, -urinary frequency, -urgency Neurology: -headache, -weakness, -tingling, -numbness       Objective:   Physical Exam BP 140/90   Pulse 68   Ht 5' 6.75" (1.695 m)   Wt 232 lb 3.2 oz (105.3 kg)   BMI 36.64 kg/m   General Appearance:    Alert, cooperative, no distress, appears stated age  Head:    Normocephalic, without obvious abnormality, atraumatic  Eyes:    PERRL, conjunctiva/corneas clear, EOM's intact, fundi    benign  Ears:    Normal TM's and external ear canals  Nose:   Nares normal, mucosa normal, no drainage or sinus   tenderness  Throat:   Lips, mucosa, and tongue normal; teeth and gums normal  Neck:   Supple, no lymphadenopathy;  thyroid:  no   enlargement/tenderness/nodules; no carotid   bruit or JVD  Back:    Spine nontender, no curvature, ROM normal, no CVA     tenderness  Lungs:     Clear to auscultation bilaterally without wheezes, rales or     ronchi; respirations unlabored  Chest Wall:    No tenderness or deformity   Heart:    Regular rate and rhythm, S1 and S2 normal, no murmur, rub   or gallop  Breast Exam:    No chest wall tenderness, masses or gynecomastia  Abdomen:     Soft, non-tender, nondistended, normoactive bowel sounds,    no masses, no hepatosplenomegaly  Genitalia:    Normal male external genitalia without lesions.  Testicles without masses.  No inguinal hernias.  Extremities:   No clubbing, cyanosis or edema  Pulses:   2+ and symmetric all extremities  Skin:   Skin color, texture, turgor normal, no rashes or lesions  Lymph nodes:   Cervical, supraclavicular, and axillary nodes normal  Neurologic:   CNII-XII intact, normal strength, sensation and gait; reflexes 2+ and symmetric throughout          Psych:   Normal mood, affect, hygiene and grooming.     Urinalysis dipstick: negative       Assessment & Plan:  Routine general medical examination at a health care facility -  Plan: POCT Urinalysis DIP (Proadvantage Device), CBC with Differential/Platelet, Comprehensive metabolic panel, TSH, T4, free, Lipid panel  Obesity (BMI 30-39.9) - Plan: Hemoglobin A1c, Lipid panel  Obstructive sleep apnea syndrome - Plan: Home sleep test  Gastroesophageal reflux disease, esophagitis presence not specified  TB (tuberculosis), treated  Family history of diabetes mellitus in brother - Plan: Hemoglobin A1c  Decreased libido - Plan: Testosterone  Fatigue, unspecified type - Plan: Testosterone, Home sleep test  Screening for lipid disorders - Plan: Lipid panel  Need for diphtheria-tetanus-pertussis (Tdap) vaccine - Plan: Tdap vaccine greater than or equal to 7yo IM  Screen for STD (sexually transmitted disease) - Plan: RPR, GC/Chlamydia Probe Amp, HIV Antibody (routine testing w rflx)  Is a 42 year old male patient who is new to the practice and here for a fasting CPE. Discussed multiple etiologies for fatigue and will check labs. Counseling on healthy diet and exercise for weight and to improve overall health. He would like STD testing. Tdap given and counseling done on all components of the vaccine. History of sleep apnea and has not been using his CPAP.  He will need a new sleep study.  Discussed potential long-term consequences of untreated sleep apnea.  Also discussed this is very possible etiology for fatigue Follow-up pending labs

## 2018-02-01 LAB — CBC WITH DIFFERENTIAL/PLATELET
Basophils Absolute: 0 10*3/uL (ref 0.0–0.2)
Basos: 1 %
EOS (ABSOLUTE): 0.1 10*3/uL (ref 0.0–0.4)
EOS: 2 %
HEMATOCRIT: 44.3 % (ref 37.5–51.0)
Hemoglobin: 14.9 g/dL (ref 13.0–17.7)
Immature Grans (Abs): 0 10*3/uL (ref 0.0–0.1)
Immature Granulocytes: 0 %
LYMPHS ABS: 1.8 10*3/uL (ref 0.7–3.1)
Lymphs: 38 %
MCH: 30.8 pg (ref 26.6–33.0)
MCHC: 33.6 g/dL (ref 31.5–35.7)
MCV: 92 fL (ref 79–97)
MONOS ABS: 0.4 10*3/uL (ref 0.1–0.9)
Monocytes: 9 %
Neutrophils Absolute: 2.4 10*3/uL (ref 1.4–7.0)
Neutrophils: 50 %
PLATELETS: 249 10*3/uL (ref 150–450)
RBC: 4.84 x10E6/uL (ref 4.14–5.80)
RDW: 11.9 % — AB (ref 12.3–15.4)
WBC: 4.8 10*3/uL (ref 3.4–10.8)

## 2018-02-01 LAB — TESTOSTERONE: TESTOSTERONE: 327 ng/dL (ref 264–916)

## 2018-02-01 LAB — COMPREHENSIVE METABOLIC PANEL
A/G RATIO: 2.1 (ref 1.2–2.2)
ALK PHOS: 113 IU/L (ref 39–117)
ALT: 29 IU/L (ref 0–44)
AST: 19 IU/L (ref 0–40)
Albumin: 5 g/dL (ref 3.5–5.5)
BUN/Creatinine Ratio: 10 (ref 9–20)
BUN: 12 mg/dL (ref 6–24)
Bilirubin Total: 0.4 mg/dL (ref 0.0–1.2)
CO2: 25 mmol/L (ref 20–29)
Calcium: 10.1 mg/dL (ref 8.7–10.2)
Chloride: 102 mmol/L (ref 96–106)
Creatinine, Ser: 1.23 mg/dL (ref 0.76–1.27)
GFR calc Af Amer: 83 mL/min/{1.73_m2} (ref >59)
GFR calc non Af Amer: 71 mL/min/{1.73_m2} (ref >59)
GLOBULIN, TOTAL: 2.4 g/dL (ref 1.5–4.5)
Glucose: 96 mg/dL (ref 65–99)
POTASSIUM: 4.5 mmol/L (ref 3.5–5.2)
SODIUM: 144 mmol/L (ref 134–144)
Total Protein: 7.4 g/dL (ref 6.0–8.5)

## 2018-02-01 LAB — HEMOGLOBIN A1C
Est. average glucose Bld gHb Est-mCnc: 120 mg/dL
Hgb A1c MFr Bld: 5.8 % — ABNORMAL HIGH (ref 4.8–5.6)

## 2018-02-01 LAB — T4, FREE: Free T4: 1.21 ng/dL (ref 0.82–1.77)

## 2018-02-01 LAB — TSH: TSH: 2.02 u[IU]/mL (ref 0.450–4.500)

## 2018-02-01 LAB — LIPID PANEL
CHOL/HDL RATIO: 4.5 ratio (ref 0.0–5.0)
Cholesterol, Total: 190 mg/dL (ref 100–199)
HDL: 42 mg/dL (ref >39)
LDL CALC: 138 mg/dL — AB (ref 0–99)
Triglycerides: 49 mg/dL (ref 0–149)
VLDL CHOLESTEROL CAL: 10 mg/dL (ref 5–40)

## 2018-02-01 LAB — HIV ANTIBODY (ROUTINE TESTING W REFLEX): HIV Screen 4th Generation wRfx: NONREACTIVE

## 2018-02-01 LAB — RPR: RPR: NONREACTIVE

## 2018-02-04 LAB — GC/CHLAMYDIA PROBE AMP
Chlamydia trachomatis, NAA: NEGATIVE
Neisseria gonorrhoeae by PCR: NEGATIVE

## 2018-02-19 ENCOUNTER — Ambulatory Visit: Payer: BLUE CROSS/BLUE SHIELD | Admitting: Family Medicine

## 2018-02-19 ENCOUNTER — Encounter: Payer: Self-pay | Admitting: Family Medicine

## 2018-02-19 VITALS — BP 120/80 | HR 83 | Temp 98.4°F | Resp 16 | Ht 66.25 in | Wt 231.4 lb

## 2018-02-19 DIAGNOSIS — R7303 Prediabetes: Secondary | ICD-10-CM

## 2018-02-19 DIAGNOSIS — R7989 Other specified abnormal findings of blood chemistry: Secondary | ICD-10-CM | POA: Diagnosis not present

## 2018-02-19 DIAGNOSIS — R5383 Other fatigue: Secondary | ICD-10-CM

## 2018-02-19 DIAGNOSIS — E7800 Pure hypercholesterolemia, unspecified: Secondary | ICD-10-CM | POA: Diagnosis not present

## 2018-02-19 HISTORY — DX: Pure hypercholesterolemia, unspecified: E78.00

## 2018-02-19 NOTE — Patient Instructions (Addendum)
We will call you with lab results.   Good job with the healthy diet changes, keep this up.  Make sure you get at least 150 minutes of physical activity per week.   Prediabetes Eating Plan Prediabetes is a condition that causes blood sugar (glucose) levels to be higher than normal. This increases the risk for developing diabetes. In order to prevent diabetes from developing, your health care provider may recommend a diet and other lifestyle changes to help you:  Control your blood glucose levels.  Improve your cholesterol levels.  Manage your blood pressure. Your health care provider may recommend working with a diet and nutrition specialist (dietitian) to make a meal plan that is best for you. What are tips for following this plan? Lifestyle  Set weight loss goals with the help of your health care team. It is recommended that most people with prediabetes lose 7% of their current body weight.  Exercise for at least 30 minutes at least 5 days a week.  Attend a support group or seek ongoing support from a mental health counselor.  Take over-the-counter and prescription medicines only as told by your health care provider. Reading food labels  Read food labels to check the amount of fat, salt (sodium), and sugar in prepackaged foods. Avoid foods that have: ? Saturated fats. ? Trans fats. ? Added sugars.  Avoid foods that have more than 300 milligrams (mg) of sodium per serving. Limit your daily sodium intake to less than 2,300 mg each day. Shopping  Avoid buying pre-made and processed foods. Cooking  Cook with olive oil. Do not use butter, lard, or ghee.  Bake, broil, grill, or boil foods. Avoid frying. Meal planning   Work with your dietitian to develop an eating plan that is right for you. This may include: ? Tracking how many calories you take in. Use a food diary, notebook, or mobile application to track what you eat at each meal. ? Using the glycemic index (GI) to plan  your meals. The index tells you how quickly a food will raise your blood glucose. Choose low-GI foods. These foods take a longer time to raise blood glucose.  Consider following a Mediterranean diet. This diet includes: ? Several servings each day of fresh fruits and vegetables. ? Eating fish at least twice a week. ? Several servings each day of whole grains, beans, nuts, and seeds. ? Using olive oil instead of other fats. ? Moderate alcohol consumption. ? Eating small amounts of red meat and whole-fat dairy.  If you have high blood pressure, you may need to limit your sodium intake or follow a diet such as the DASH eating plan. DASH is an eating plan that aims to lower high blood pressure. What foods are recommended? The items listed below may not be a complete list. Talk with your dietitian about what dietary choices are best for you. Grains Whole grains, such as whole-wheat or whole-grain breads, crackers, cereals, and pasta. Unsweetened oatmeal. Bulgur. Barley. Quinoa. Brown rice. Corn or whole-wheat flour tortillas or taco shells. Vegetables Lettuce. Spinach. Peas. Beets. Cauliflower. Cabbage. Broccoli. Carrots. Tomatoes. Squash. Eggplant. Herbs. Peppers. Onions. Cucumbers. Brussels sprouts. Fruits Berries. Bananas. Apples. Oranges. Grapes. Papaya. Mango. Pomegranate. Kiwi. Grapefruit. Cherries. Meats and other protein foods Seafood. Poultry without skin. Lean cuts of pork and beef. Tofu. Eggs. Nuts. Beans. Dairy Low-fat or fat-free dairy products, such as yogurt, cottage cheese, and cheese. Beverages Water. Tea. Coffee. Sugar-free or diet soda. Seltzer water. Lowfat or no-fat milk. Milk alternatives,  such as soy or almond milk. Fats and oils Olive oil. Canola oil. Sunflower oil. Grapeseed oil. Avocado. Walnuts. Sweets and desserts Sugar-free or low-fat pudding. Sugar-free or low-fat ice cream and other frozen treats. Seasoning and other foods Herbs. Sodium-free spices. Mustard.  Relish. Low-fat, low-sugar ketchup. Low-fat, low-sugar barbecue sauce. Low-fat or fat-free mayonnaise. What foods are not recommended? The items listed below may not be a complete list. Talk with your dietitian about what dietary choices are best for you. Grains Refined white flour and flour products, such as bread, pasta, snack foods, and cereals. Vegetables Canned vegetables. Frozen vegetables with butter or cream sauce. Fruits Fruits canned with syrup. Meats and other protein foods Fatty cuts of meat. Poultry with skin. Breaded or fried meat. Processed meats. Dairy Full-fat yogurt, cheese, or milk. Beverages Sweetened drinks, such as sweet iced tea and soda. Fats and oils Butter. Lard. Ghee. Sweets and desserts Baked goods, such as cake, cupcakes, pastries, cookies, and cheesecake. Seasoning and other foods Spice mixes with added salt. Ketchup. Barbecue sauce. Mayonnaise. Summary  To prevent diabetes from developing, you may need to make diet and other lifestyle changes to help control blood sugar, improve cholesterol levels, and manage your blood pressure.  Set weight loss goals with the help of your health care team. It is recommended that most people with prediabetes lose 7 percent of their current body weight.  Consider following a Mediterranean diet that includes plenty of fresh fruits and vegetables, whole grains, beans, nuts, seeds, fish, lean meat, low-fat dairy, and healthy oils. This information is not intended to replace advice given to you by your health care provider. Make sure you discuss any questions you have with your health care provider. Document Released: 06/15/2014 Document Revised: 04/04/2016 Document Reviewed: 04/04/2016 Elsevier Interactive Patient Education  2019 [employer]

## 2018-02-19 NOTE — Progress Notes (Signed)
   Subjective:    Patient ID: KYLIE LOTTI, male    DOB: 1975/10/21, 43 y.o.   MRN: [id number]  HPI Chief Complaint  Patient presents with  . follow up    follow up testosterone   He is here to follow up on abnormal lab results including low serum testosterone, elevated hemoglobin A1c at 5.8%, elevated LDL.  Denies issues with muscle weakness or decrease stamina.  At his previous visit we discussed untreated sleep apnea and that his fatigue is most likely related to this. States he has a sleep study scheduled January 15th.   States he has already made healthy diet changes.  He has cut back on fried foods and drinking more water.  Denies fever, chills, chest pain, palpitations, shortness of breath, abdominal pain, nausea, vomiting, diarrhea.   Review of Systems Pertinent positives and negatives in the history of present illness.     Objective:   Physical Exam BP 120/80   Pulse 83   Temp 98.4 F (36.9 C) (Oral)   Resp 16   Ht 5' 6.25" (1.683 m)   Wt 231 lb 6.4 oz (105 kg)   SpO2 95%   BMI 37.07 kg/m   Alert and oriented and in no acute distress. Not otherwise examined.       Assessment & Plan:  Low testosterone - Plan: Testosterone,Free and Total  Prediabetes  Elevated LDL cholesterol level  Fatigue, unspecified type - Plan: Testosterone,Free and Total  Counseled on abnormal lab results and healthy lifestyle changes including diet and exercise.  Congratulated him on making healthy diet changes already. Discussed prediabetes and elevated LDL and potential long-term health consequences related to this. His testosterone level was low normal.  We will recheck this today.  He is aware that if his level is still low that we will need to discuss starting testosterone replacement therapy. History of sleep apnea and is not currently using a CPAP.  He is scheduled for a new sleep study later this month.  Suspect his fatigue is related to untreated sleep  apnea. Follow-up pending lab results.

## 2018-02-20 LAB — TESTOSTERONE,FREE AND TOTAL
TESTOSTERONE FREE: 9.1 pg/mL (ref 6.8–21.5)
TESTOSTERONE: 295 ng/dL (ref 264–916)

## 2018-02-21 ENCOUNTER — Encounter: Payer: Self-pay | Admitting: Family Medicine

## 2018-02-26 ENCOUNTER — Ambulatory Visit (HOSPITAL_BASED_OUTPATIENT_CLINIC_OR_DEPARTMENT_OTHER): Payer: BLUE CROSS/BLUE SHIELD | Attending: Family Medicine | Admitting: Internal Medicine

## 2018-02-26 DIAGNOSIS — G4733 Obstructive sleep apnea (adult) (pediatric): Secondary | ICD-10-CM | POA: Diagnosis not present

## 2018-02-26 DIAGNOSIS — R5383 Other fatigue: Secondary | ICD-10-CM | POA: Diagnosis not present

## 2018-03-09 DIAGNOSIS — G4733 Obstructive sleep apnea (adult) (pediatric): Secondary | ICD-10-CM

## 2018-03-09 NOTE — Procedures (Signed)
    Patient Name: Kyle Patton, Kyle Patton Date: 02/26/2018 Gender: Male D.O.B: December 18, 1975 Age (years): 34 Referring Provider: Avanell Shackleton NP Height (inches): 66 Interpreting Physician: Jetty Duhamel MD, ABSM Weight (lbs): 230 RPSGT: Celene Kras BMI: 37 MRN: [id number] Neck Size: 17.50  CLINICAL INFORMATION Sleep Study Type: HST Indication for sleep study: Fatigue, OSA Epworth Sleepiness Score: 11  SLEEP STUDY TECHNIQUE A multi-channel overnight portable sleep study was performed. The channels recorded were: nasal airflow, thoracic respiratory movement, and oxygen saturation with a pulse oximetry. Snoring was also monitored.  MEDICATIONS Patient self administered medications include: none reported.  SLEEP ARCHITECTURE Patient was studied for 476.9 minutes. The sleep efficiency was 100.0 % and the patient was supine for 93.6%. The arousal index was 0.0 per hour.  RESPIRATORY PARAMETERS The overall AHI was 85.3 per hour, with a central apnea index of 0.0 per hour. The oxygen nadir was 54% during sleep.  CARDIAC DATA Mean heart rate during sleep was 77.1 bpm.  IMPRESSIONS - Severe obstructive sleep apnea occurred during this study (AHI = 85.3/h). - No significant central sleep apnea occurred during this study (CAI = 0.0/h). - Severe oxygen desaturation was noted during this study (Min O2 = 54%). Mean sat 80%. - Time with O2 saturation 89% or less was 285.3 minutes. - Patient snored.  DIAGNOSIS - Obstructive Sleep Apnea (327.23 [G47.33 ICD-10]) - Nocturnal Hypoxemia (327.26 [G47.36 ICD-10])  RECOMMENDATIONS - Suggest formal CPAP titration sleep study. This will document if CPAP/ BIPAP is sufficient to correct the nocturnal hypoxemia, or if supplemental O2 should be consdered. Other options would be based on clinical judgment. - Be careful with alcohol, sedatives and other CNS depressants that may worsen sleep apnea and disrupt normal sleep architecture. - Sleep  hygiene should be reviewed to assess factors that may improve sleep quality. - Weight management and regular exercise should be initiated or continued.  [Electronically signed] 03/09/2018 01:41 PM  Jetty Duhamel MD, ABSM Diplomate, American Board of Sleep Medicine   NPI: [id number]                         Jetty Duhamel Diplomate, American Board of Sleep Medicine  ELECTRONICALLY SIGNED ON:  03/09/2018, 1:42 PM [facility] PH: [phone]   FX: [phone] ACCREDITED BY THE AMERICAN ACADEMY OF SLEEP MEDICINE

## 2018-03-10 ENCOUNTER — Ambulatory Visit: Payer: BLUE CROSS/BLUE SHIELD | Admitting: Family Medicine

## 2018-03-12 ENCOUNTER — Ambulatory Visit: Payer: BLUE CROSS/BLUE SHIELD | Admitting: Family Medicine

## 2018-03-12 ENCOUNTER — Encounter: Payer: Self-pay | Admitting: Family Medicine

## 2018-03-12 VITALS — BP 120/70 | HR 87 | Wt 238.6 lb

## 2018-03-12 DIAGNOSIS — G4734 Idiopathic sleep related nonobstructive alveolar hypoventilation: Secondary | ICD-10-CM | POA: Insufficient documentation

## 2018-03-12 DIAGNOSIS — R5383 Other fatigue: Secondary | ICD-10-CM | POA: Diagnosis not present

## 2018-03-12 DIAGNOSIS — G4733 Obstructive sleep apnea (adult) (pediatric): Secondary | ICD-10-CM

## 2018-03-12 DIAGNOSIS — E669 Obesity, unspecified: Secondary | ICD-10-CM | POA: Diagnosis not present

## 2018-03-12 NOTE — Progress Notes (Signed)
   Subjective:    Patient ID: Kyle Patton, male    DOB: 07-09-1975, 43 y.o.   MRN: [id number]  HPI Chief Complaint  Patient presents with  . discuss sleep study    discuss sleep study and testosterone results   He is here to discuss abnormal lab results and sleep study results. Low normal testosterone. History of this as far back as 2016 per records with normal FSH, LH and Prolactin in April 2016.   Home sleep test showed the following:   IMPRESSIONS - Severe obstructive sleep apnea occurred during this study (AHI = 85.3/h). - No significant central sleep apnea occurred during this study (CAI = 0.0/h). - Severe oxygen desaturation was noted during this study (Min O2 = 54%). Mean sat 80%. - Time with O2 saturation 89% or less was 285.3 minutes. - Patient snored.  DIAGNOSIS - Obstructive Sleep Apnea (327.23 [G47.33 ICD-10]) - Nocturnal Hypoxemia (327.26 [G47.36 ICD-10])  RECOMMENDATIONS - Suggest formal CPAP titration sleep study. This will document if CPAP/ BIPAP is sufficient to correct the nocturnal hypoxemia, or if supplemental O2 should be consdered. Other options would be based on clinical judgment. - Be careful with alcohol, sedatives and other CNS depressants that may worsen sleep apnea and disrupt normal sleep architecture. - Sleep hygiene should be reviewed to assess factors that may improve sleep quality. - Weight management and regular exercise should be initiated or continued.   States he is exercising now. Has been to the gym several times since his last visit. Making healthy diet changes.   No new symptoms or concerns.   Denies fever, chills, dizziness, chest pain, palpitations, shortness of breath, abdominal pain, N/V/D, urinary symptoms, LE edema.    Review of Systems Pertinent positives and negatives in the history of present illness.     Objective:   Physical Exam BP 120/70   Pulse 87   Wt 238 lb 9.6 oz (108.2 kg)   BMI 38.22 kg/m   Alert and  oriented and in no acute distress. Not otherwise examined.      Assessment & Plan:  OSA (obstructive sleep apnea)  Fatigue, unspecified type  Nocturnal hypoxemia  Obesity (BMI 30-39.9)  Discussed that we will get the recommended sleep titration study to determine if he needs CPAP/BiPAP or supplemental oxygen. Suspect his fatigue is related to OSA.  He will avoid any sedating medications or alcohol and avoid sleeping supine.  Hold off on testosterone replacement for now.  Follow up pending sleep study.

## 2018-03-19 ENCOUNTER — Telehealth: Payer: Self-pay | Admitting: Internal Medicine

## 2018-03-19 NOTE — Telephone Encounter (Signed)
Spoke with physician and completed peer-to-peer review as instructed. Authorization #[id number]  Authorization is good until May 17, 2018  Please proceed with CPAP titration as ordered

## 2018-03-19 NOTE — Telephone Encounter (Signed)
Kyle Patton was notified and will continue with order

## 2018-03-19 NOTE — Telephone Encounter (Signed)
Terri from sleep lab called and states that insurance wants a peer to peer review as they are not wanting to cover the cpap Titration. Please call for peer to peer  [phone] Pt has [employer]- ID- [id number]   If approved or deny, terri at sleep lab needs to be notifiied [phone]

## 2018-03-21 ENCOUNTER — Ambulatory Visit (HOSPITAL_BASED_OUTPATIENT_CLINIC_OR_DEPARTMENT_OTHER): Payer: BLUE CROSS/BLUE SHIELD | Attending: Family Medicine | Admitting: Internal Medicine

## 2018-03-21 VITALS — Ht 66.0 in | Wt 230.0 lb

## 2018-03-21 DIAGNOSIS — G4733 Obstructive sleep apnea (adult) (pediatric): Secondary | ICD-10-CM | POA: Diagnosis not present

## 2018-03-21 DIAGNOSIS — E669 Obesity, unspecified: Secondary | ICD-10-CM | POA: Diagnosis not present

## 2018-03-21 DIAGNOSIS — G4734 Idiopathic sleep related nonobstructive alveolar hypoventilation: Secondary | ICD-10-CM | POA: Diagnosis not present

## 2018-03-21 DIAGNOSIS — R5383 Other fatigue: Secondary | ICD-10-CM | POA: Diagnosis not present

## 2018-03-21 DIAGNOSIS — Z6830 Body mass index (BMI) 30.0-30.9, adult: Secondary | ICD-10-CM | POA: Insufficient documentation

## 2018-03-30 DIAGNOSIS — G4733 Obstructive sleep apnea (adult) (pediatric): Secondary | ICD-10-CM | POA: Diagnosis not present

## 2018-03-30 NOTE — Procedures (Signed)
Patient Name: Kyle Patton, Kyle Patton Date: 03/21/2018 Gender: Male D.O.B: [birth year] Age (years): 56 Referring Provider: Avanell Shackleton NP Height (inches): 66 Interpreting Physician: Jetty Duhamel MD, ABSM Weight (lbs): 230 RPSGT: Cherylann Parr BMI: 37 MRN: [id number] Neck Size: 17.50  CLINICAL INFORMATION The patient is referred for a CPAP titration to treat sleep apnea.  Date of NPSG, Split Night or HST:    HST 02/26/2018  AHI 85.3/ hr, desaturation to 54%, body weight 230 lbs  SLEEP STUDY TECHNIQUE As per the AASM Manual for the Scoring of Sleep and Associated Events v2.3 (April 2016) with a hypopnea requiring 4% desaturations.  The channels recorded and monitored were frontal, central and occipital EEG, electrooculogram (EOG), submentalis EMG (chin), nasal and oral airflow, thoracic and abdominal wall motion, anterior tibialis EMG, snore microphone, electrocardiogram, and pulse oximetry. Continuous positive airway pressure (CPAP) was initiated at the beginning of the study and titrated to treat sleep-disordered breathing.  MEDICATIONS Medications self-administered by patient taken the night of the study : none reported  TECHNICIAN COMMENTS Comments added by technician: SIMPLUS FULL FACE MASK WAS USED FOR THIS STUDY. ROOM AIR Comments added by scorer: N/A  RESPIRATORY PARAMETERS Optimal PAP Pressure (cm): 14 AHI at Optimal Pressure (/hr): 0.0 Overall Minimal O2 (%): 74.0 Supine % at Optimal Pressure (%): 0 Minimal O2 at Optimal Pressure (%): 92.0   SLEEP ARCHITECTURE The study was initiated at 10:28:28 PM and ended at 4:32:55 AM.  Sleep onset time was 1.7 minutes and the sleep efficiency was 85.8%%. The total sleep time was 312.7 minutes.  The patient spent 1.6%% of the night in stage N1 sleep, 62.6%% in stage N2 sleep, 0.0%% in stage N3 and 35.8% in REM.Stage REM latency was 24.5 minutes  Wake after sleep onset was 50.0. Alpha intrusion was absent. Supine sleep was  1.28%.  CARDIAC DATA The 2 lead EKG demonstrated sinus rhythm. The mean heart rate was 73.7 beats per minute. Other EKG findings include: None.  LEG MOVEMENT DATA The total Periodic Limb Movements of Sleep (PLMS) were 0. The PLMS index was 0.0. A PLMS index of <15 is considered normal in adults.  IMPRESSIONS - The optimal PAP pressure was 14 cm of water. - Central sleep apnea was not noted during this titration (CAI = 0.0/h). - CPAP corrected hypoxemia relative to initial diagnostic study.  Minimum sat at CPAP 14 was 92%. - No snoring was audible during this study. - No cardiac abnormalities were observed during this study. - Clinically significant periodic limb movements were not noted during this study. Arousals associated with PLMs were rare.  DIAGNOSIS - Obstructive Sleep Apnea (327.23 [G47.33 ICD-10])  RECOMMENDATIONS - Trial of CPAP therapy on 14 cm H2O or autopap 10-20. - Patient used a Medium size [employer] Full Face Mask Simplus mask and heated humidification. - Be careful with alcohol, sedatives and other CNS depressants that may worsen sleep apnea and disrupt normal sleep architecture. - Sleep hygiene should be reviewed to assess factors that may improve sleep quality. - Weight management and regular exercise should be initiated or continued.  [Electronically signed] 03/30/2018 02:58 PM  Jetty Duhamel MD, ABSM Diplomate, American Board of Sleep Medicine   NPI: [id number]                         Jetty Duhamel Diplomate, American Board of Sleep Medicine  ELECTRONICALLY SIGNED ON:  03/30/2018, 2:53 PM [facility] PH: [phone]  FX: [phone] [place]

## 2018-03-31 ENCOUNTER — Other Ambulatory Visit: Payer: Self-pay | Admitting: Internal Medicine

## 2018-03-31 DIAGNOSIS — G4733 Obstructive sleep apnea (adult) (pediatric): Secondary | ICD-10-CM

## 2018-04-24 IMAGING — DX DG HIP (WITH OR WITHOUT PELVIS) 2-3V*R*
3 series · 3 of 3 positions shown · non-contrast
Comparison: None.

CLINICAL DATA: Right hip pain for 1 week.

EXAM:
DG HIP (WITH OR WITHOUT PELVIS) 2-3V RIGHT

[pelvis ap]
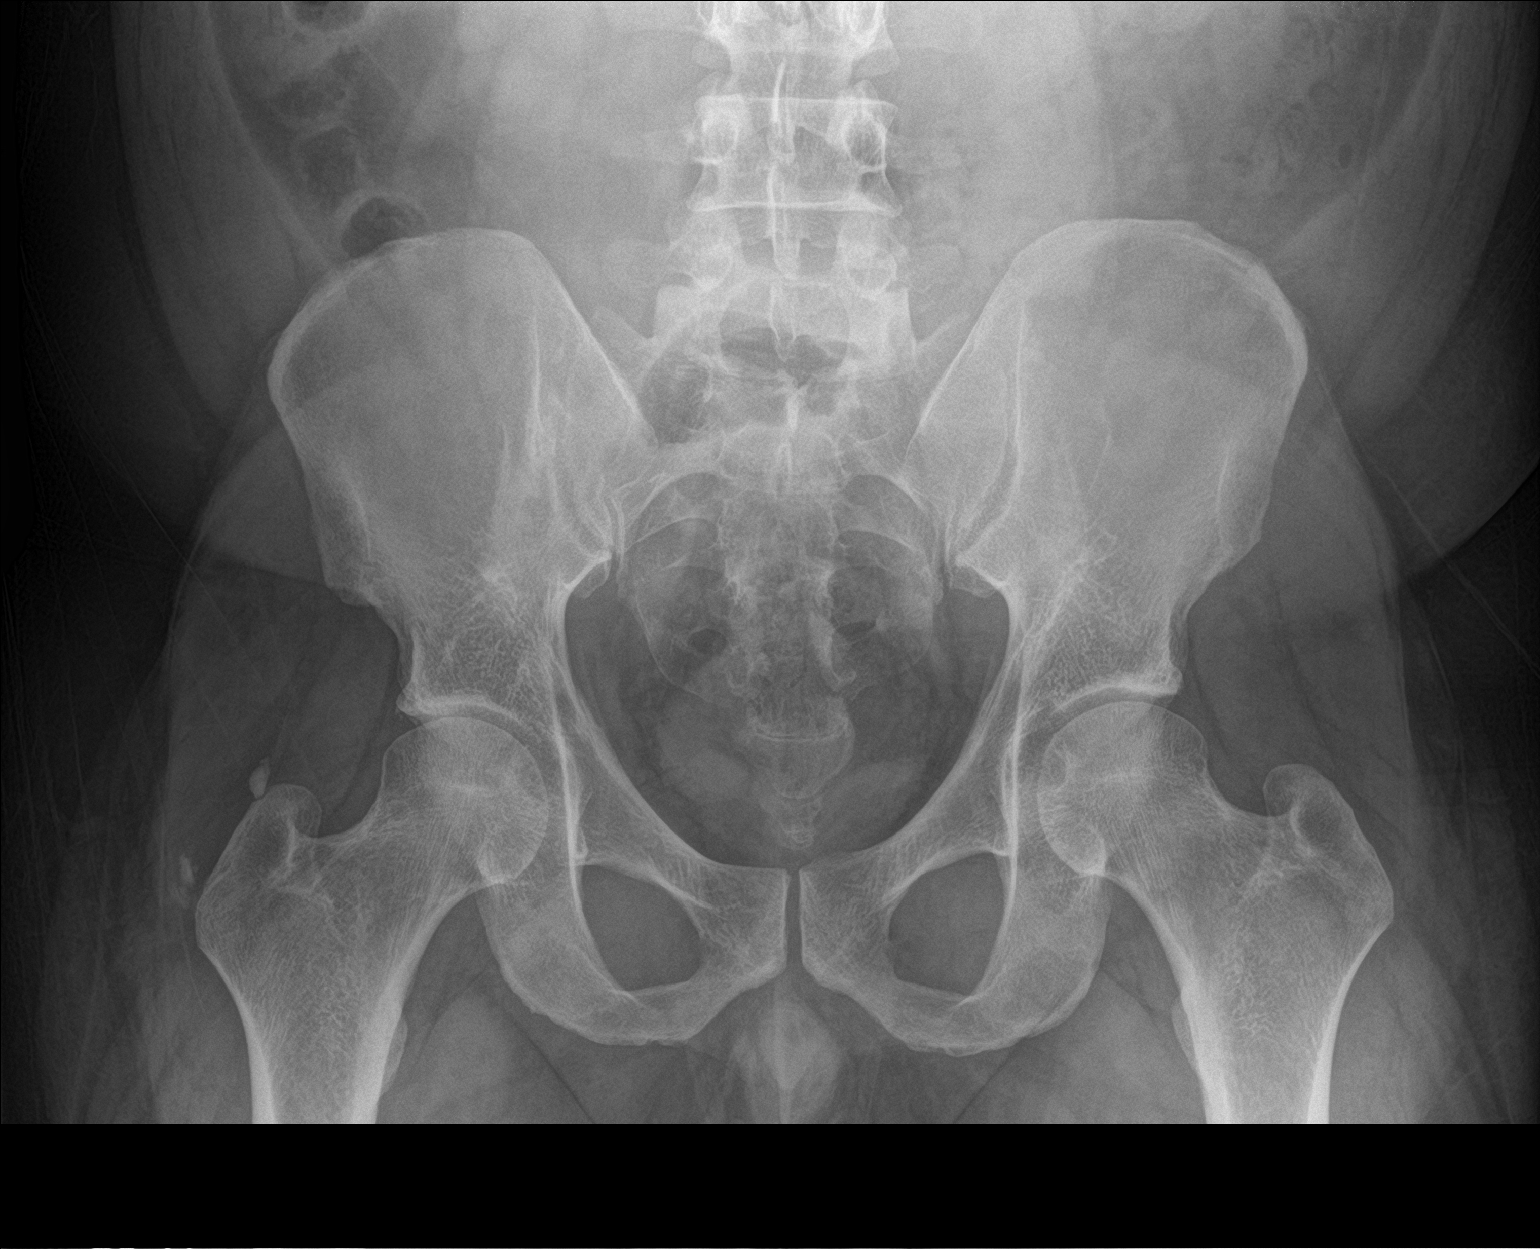

[hip ap]
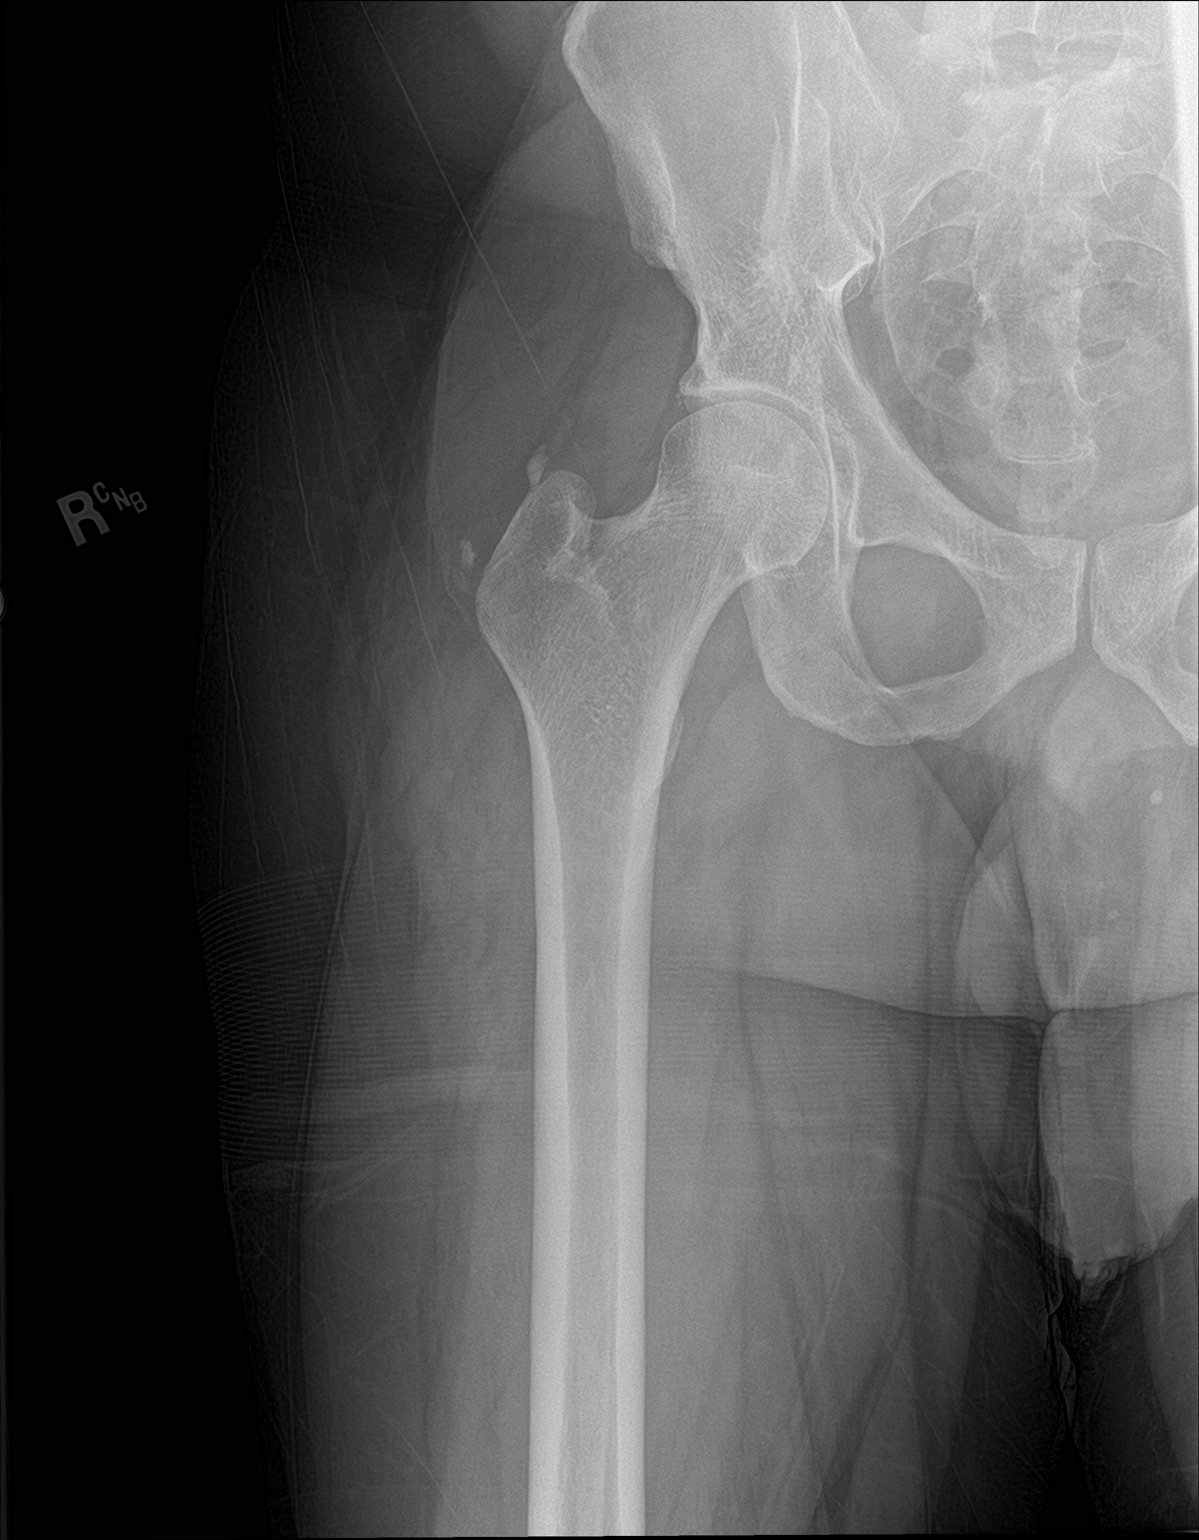

[hip lat]
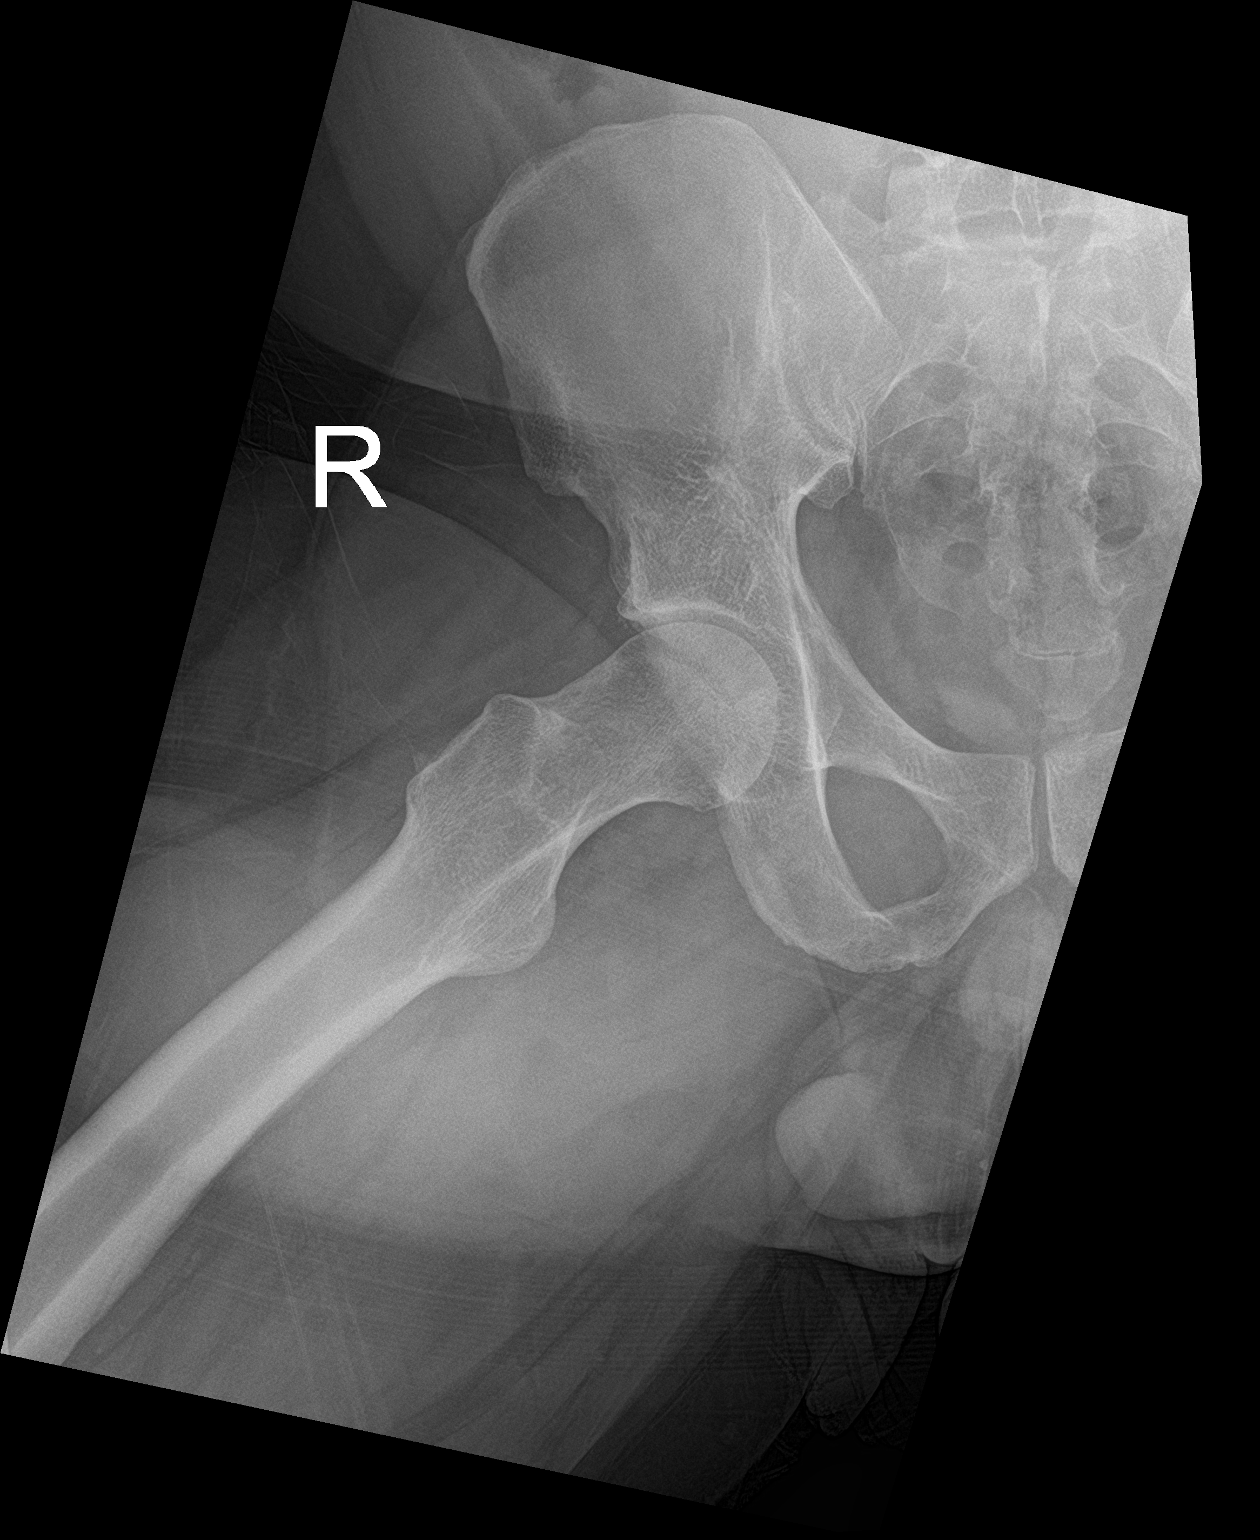

[3 of 3 positions shown; findings below may reference images not displayed]

FINDINGS: Asymmetric calcification around the right greater trochanter. No
fracture or malalignment. No erosive changes or degenerative joint
narrowing/spurring.
IMPRESSION: 1. Asymmetric calcification around the right greater trochanter
which could be calcific tendinitis or enthesophytes.
2. No acute osseous finding or degenerative joint narrowing.

## 2018-05-30 ENCOUNTER — Ambulatory Visit: Payer: BLUE CROSS/BLUE SHIELD | Admitting: Family Medicine

## 2018-05-30 ENCOUNTER — Other Ambulatory Visit: Payer: Self-pay

## 2018-06-02 ENCOUNTER — Ambulatory Visit: Payer: BLUE CROSS/BLUE SHIELD | Admitting: Family Medicine

## 2018-06-02 ENCOUNTER — Encounter: Payer: Self-pay | Admitting: Family Medicine

## 2018-06-02 ENCOUNTER — Other Ambulatory Visit: Payer: Self-pay

## 2018-06-02 VITALS — Wt 238.0 lb

## 2018-06-02 DIAGNOSIS — G4733 Obstructive sleep apnea (adult) (pediatric): Secondary | ICD-10-CM | POA: Diagnosis not present

## 2018-06-02 DIAGNOSIS — N529 Male erectile dysfunction, unspecified: Secondary | ICD-10-CM

## 2018-06-02 HISTORY — DX: Male erectile dysfunction, unspecified: N52.9

## 2018-06-02 MED ORDER — SILDENAFIL CITRATE 20 MG PO TABS
ORAL_TABLET | ORAL | 0 refills | Status: DC
Start: 2018-06-02 — End: 2018-06-17

## 2018-06-02 NOTE — Progress Notes (Signed)
   Subjective:    Patient ID: Kyle Patton, male    DOB: 06/03/1975, 43 y.o.   MRN: [id number]  Interactive audio and video telecommunications were attempted between this provider and patient, however due to patient not having access to video capability, we continued and completed visit with audio only.  The patient was located at home. 2 patient identifiers.  The provider was located in the office. The patient did consent to this visit and is aware of possible charges through their insurance for this visit.  The other persons participating in this telemedicine service were none.   HPI Chief Complaint  Patient presents with  . discuss viagra    discuss viagra, never used this before or another other ED meds   Concerns today regarding ED. States he is having difficulty getting an erection, keeping an erection and overall his sexual performance is not as good as in the past. Requests to try Viagra. States he has never tried medication for ED.   OSA per sleep study 03/21/2018. Needed CPAP on autopap but states getting a new machine was too expensive. He is using his old machine and states the pressure is not the same, cannot adjust the pressure.  Has been using it nightly for 2 weeks. States he can tell a difference and has more energy.  Needs to go to [facility] to get the disc downloaded.   Denies fever, chills, dizziness, chest pain, palpitations, shortness of breath, abdominal pain, N/V/D, urinary symptoms, LE edema.    Reviewed allergies, medications, past medical, surgical, family, and social history.    Review of Systems Pertinent positives and negatives in the history of present illness.     Objective:   Physical Exam Wt 238 lb (108 kg)   BMI 38.41 kg/m   Alert and oriented and in no acute distress. Not otherwise examined.       Assessment & Plan:  Erectile dysfunction, unspecified erectile dysfunction type - Plan: sildenafil (REVATIO) 20 MG tablet   Obstructive sleep apnea syndrome  Discussed limitations of virtual visit.  Discussed proper administration and potential side effects of medication.  Advised that he has a history of low testosterone and we have considered replacement. We will address this again at upcoming appt.  He is using his old CPAP and has been using it nightly for the past 2 weeks. Plans to contact [facility] to discuss getting compliance downloaded and hopes to get a new machine in the future if it is affordable.   Time spent on call was 17 minutes and in review of previous records 2 minutes total.  This virtual service is not related to other E/M service within previous 7 days.

## 2018-06-17 ENCOUNTER — Telehealth: Payer: Self-pay | Admitting: Family Medicine

## 2018-06-17 ENCOUNTER — Other Ambulatory Visit: Payer: Self-pay | Admitting: Family Medicine

## 2018-06-17 DIAGNOSIS — N529 Male erectile dysfunction, unspecified: Secondary | ICD-10-CM

## 2018-06-17 MED ORDER — SILDENAFIL CITRATE 20 MG PO TABS: ORAL_TABLET | ORAL | 0 refills | Status: DC

## 2018-06-17 NOTE — Telephone Encounter (Signed)
Taken care of. Thanks!

## 2018-06-17 NOTE — Telephone Encounter (Signed)
Pt called and states [facility] is too expensive for the Viagra.  Please send to the [facility] at [place] you told him about.

## 2018-07-15 ENCOUNTER — Ambulatory Visit (INDEPENDENT_AMBULATORY_CARE_PROVIDER_SITE_OTHER): Payer: BLUE CROSS/BLUE SHIELD | Admitting: Family Medicine

## 2018-07-15 ENCOUNTER — Encounter: Payer: Self-pay | Admitting: Family Medicine

## 2018-07-15 ENCOUNTER — Other Ambulatory Visit: Payer: Self-pay

## 2018-07-15 VITALS — Wt 230.0 lb

## 2018-07-15 DIAGNOSIS — R1031 Right lower quadrant pain: Secondary | ICD-10-CM | POA: Diagnosis not present

## 2018-07-15 NOTE — Progress Notes (Signed)
   Subjective:   Documentation for virtual audio and video telecommunications through [employer] encounter:  The patient was located at home. 2 patient identifiers used.  The provider was located in the office. The patient did consent to this visit and is aware of possible charges through their insurance for this visit.  The other persons participating in this telemedicine service were none.    Patient ID: Kyle Patton, male    DOB: [birth year], 43 y.o.   MRN: [id number]  HPI Chief Complaint  Patient presents with  . pulled muscle    pulled muscle- doing sit ups at home and next morning was sore, discomfort with movement   Complains of a 4 week history of intermittent RLQ pain that he describes as soreness. Pain is only present with certain movements, specifically when he does a sit up and he does not notice it while doing his usual daily routine. States he was doing sit ups at home prior to onset of pain.  He has not taken anything for the pain. No hx of hernia. States he does not feel it in his groin, back or testicles.   Denies fever, chills, URI symptoms, chest pain, palpitations, shortness of breath, cough, N/V/D, urinary symptoms.  No changes in bowel habits.   States he had a friend die from [place] last week in [place] but he had not been around him in years.   Reviewed allergies, medications, past medical, surgical, family, and social history.   Review of Systems Pertinent positives and negatives in the history of present illness.     Objective:   Physical Exam Wt 230 lb (104.3 kg)   BMI 37.12 kg/m   Alert and oriented and in no acute distress. Respirations unlabored. He points to his right lower abdomen just to the right of his umbilicus and states this area hurts when doing a sit up. Area is tender only with deep palpation per patient. No skin changes. No radiation of pain.  Normal bowel movements and no urinary changes.       Assessment & Plan:   Intermittent right lower quadrant abdominal pain  Discussed limitations of a virtual visit.  No acute distress. No sign of an infectious process.  Offered condolences regarding the death of his friend.  Discussed that pain only with movement speaks to this being a MSK etiology and nothing worrisome.  DDx include hernia, muscle strain. Discussed red flag symptoms.  He will try taking 2 Aleve twice daily with food x 1 week. If he is not improving or if he worsens or has any new symptoms, he will follow up. Will be better to see him in the office if this persists so that I can do a physical exam.   Time spent on call was 15 minutes and in review of previous records 1 minutes total.  This virtual service is not related to other E/M service within previous 7 days.

## 2018-07-21 ENCOUNTER — Other Ambulatory Visit: Payer: Self-pay | Admitting: *Deleted

## 2018-07-21 DIAGNOSIS — Z20822 Contact with and (suspected) exposure to covid-19: Secondary | ICD-10-CM

## 2018-07-24 LAB — NOVEL CORONAVIRUS, NAA: SARS-CoV-2, NAA: NOT DETECTED

## 2018-08-11 DIAGNOSIS — T781XXD Other adverse food reactions, not elsewhere classified, subsequent encounter: Secondary | ICD-10-CM | POA: Diagnosis not present

## 2018-09-19 ENCOUNTER — Other Ambulatory Visit: Payer: Self-pay

## 2018-09-19 DIAGNOSIS — Z20822 Contact with and (suspected) exposure to covid-19: Secondary | ICD-10-CM

## 2018-09-20 LAB — SPECIMEN STATUS REPORT

## 2018-09-20 LAB — NOVEL CORONAVIRUS, NAA: SARS-CoV-2, NAA: NOT DETECTED

## 2018-12-19 ENCOUNTER — Other Ambulatory Visit: Payer: Self-pay

## 2018-12-19 DIAGNOSIS — Z20822 Contact with and (suspected) exposure to covid-19: Secondary | ICD-10-CM

## 2018-12-20 LAB — NOVEL CORONAVIRUS, NAA: SARS-CoV-2, NAA: NOT DETECTED

## 2019-02-02 ENCOUNTER — Other Ambulatory Visit: Payer: Self-pay

## 2019-02-02 DIAGNOSIS — Z20822 Contact with and (suspected) exposure to covid-19: Secondary | ICD-10-CM

## 2019-02-02 DIAGNOSIS — Z20828 Contact with and (suspected) exposure to other viral communicable diseases: Secondary | ICD-10-CM | POA: Diagnosis not present

## 2019-02-03 LAB — NOVEL CORONAVIRUS, NAA: SARS-CoV-2, NAA: NOT DETECTED

## 2019-03-02 ENCOUNTER — Encounter: Payer: Self-pay | Admitting: Family Medicine

## 2019-03-10 ENCOUNTER — Encounter: Payer: Self-pay | Admitting: Family Medicine

## 2019-03-11 ENCOUNTER — Encounter: Payer: Self-pay | Admitting: Family Medicine

## 2019-03-11 ENCOUNTER — Other Ambulatory Visit: Payer: Self-pay

## 2019-03-11 ENCOUNTER — Telehealth: Payer: BLUE CROSS/BLUE SHIELD | Admitting: Family

## 2019-03-11 ENCOUNTER — Ambulatory Visit: Payer: BC Managed Care – PPO | Admitting: Family Medicine

## 2019-03-11 VITALS — Wt 230.0 lb

## 2019-03-11 DIAGNOSIS — Z20822 Contact with and (suspected) exposure to covid-19: Secondary | ICD-10-CM

## 2019-03-11 NOTE — Progress Notes (Signed)
E-Visit for [employer], given you do not have any symptoms you do not have to test for COVID. However, if you wish to just to be certain, you need to wait 5-7 days from being exposed.  Many health care providers can now test patients at their office but not all are.  [place] has multiple testing sites. For information on our COVID testing locations and hours go to [email or web address]    Testing Information: The COVID-19 Community Testing sites will begin testing BY APPOINTMENT ONLY.  You can schedule online at [email or web address]  If you do not have access to a smart phone or computer you may call [phone] for an appointment.   Additional testing sites in the Community:  . For [facility] Testing sites in [facility]  FaceUpdate.[email or web address]  . For Pop-up testing sites in [place]  BowlDirectory.co.uk  . For Testing sites with regular hours [email or web address]  . For [place] [email or web address]  . For [facility] [email or web address]  . For [facility] testing in [place] and [employer] [email or web address]  . For [facility] testing in [facility]   [email or web address]  For  more information about community testing call [phone]   Please quarantine yourself while awaiting your test results. Please stay home for a minimum of 10 days from the first day of illness with improving symptoms and you have had 24 hours of no fever (without the use of Tylenol (Acetaminophen) Motrin (Ibuprofen) or any fever reducing medication).  Also - Do not get tested prior to returning  to work because once you have had a positive test the test can stay positive for more then a month in some cases.   You should wear a mask or cloth face covering over your nose and mouth if you must be around other people or animals, including pets (even at home). Try to stay at least 6 feet away from other people. This will protect the people around you.  Please continue good preventive care measures, including:  frequent hand-washing, avoid touching your face, cover coughs/sneezes, stay out of crowds and keep a 6 foot distance from others.  COVID-19 is a respiratory illness with symptoms that are similar to the flu. Symptoms are typically mild to moderate, but there have been cases of severe illness and death due to the virus.   The following symptoms may appear 2-14 days after exposure: . Fever . Cough . Shortness of breath or difficulty breathing . Chills . Repeated shaking with chills . Muscle pain . Headache . Sore throat . New loss of taste or smell . Fatigue . Congestion or runny nose . Nausea or vomiting . Diarrhea  Go to the nearest hospital ED for assessment if fever/cough/breathlessness are severe or illness seems like a threat to life.  It is vitally important that if you feel that you have an infection such as this virus or any other virus that you stay home and away from places where you may spread it to others.  You should avoid contact with people age 36 and older.   You may also take acetaminophen (Tylenol) as needed for fever.  Reduce your risk of any infection by using the same precautions used for avoiding the common cold or flu:  Marland Kitchen Wash your hands often with soap and warm water for at least 20 seconds.  If soap and water are not readily available, use an alcohol-based hand sanitizer with at least 60% alcohol.  . If coughing or  sneezing, cover your mouth and nose by coughing or sneezing into the elbow areas of your shirt or coat, into a tissue or into your sleeve (not  your hands). . Avoid shaking hands with others and consider head nods or verbal greetings only. . Avoid touching your eyes, nose, or mouth with unwashed hands.  . Avoid close contact with people who are sick. . Avoid places or events with large numbers of people in one location, like concerts or sporting events. . Carefully consider travel plans you have or are making. . If you are planning any travel outside or inside the [place], visit the CDC's Travelers' Health webpage for the latest health notices. . If you have some symptoms but not all symptoms, continue to monitor at home and seek medical attention if your symptoms worsen. . If you are having a medical emergency, call 911.  HOME CARE . Only take medications as instructed by your medical team. . Drink plenty of fluids and get plenty of rest. . A steam or ultrasonic humidifier can help if you have congestion.   GET HELP RIGHT AWAY IF YOU HAVE EMERGENCY WARNING SIGNS** FOR COVID-19. If you or someone is showing any of these signs seek emergency medical care immediately. Call 911 or proceed to your closest emergency facility if: . You develop worsening high fever. . Trouble breathing . Bluish lips or face . Persistent pain or pressure in the chest . New confusion . Inability to wake or stay awake . You cough up blood. . Your symptoms become more severe  **This list is not all possible symptoms. Contact your medical provider for any symptoms that are sever or concerning to you.  MAKE SURE YOU   Understand these instructions.  Will watch your condition.  Will get help right away if you are not doing well or get worse.  Your e-visit answers were reviewed by a board certified advanced clinical practitioner to complete your personal care plan.  Depending on the condition, your plan could have included both over the counter or prescription medications.  If there is a problem please reply once you have received a response from your  provider.  Your safety is important to [place].  If you have drug allergies check your prescription carefully.    You can use MyChart to ask questions about today's visit, request a non-urgent call back, or ask for a work or school excuse for 24 hours related to this e-Visit. If it has been greater than 24 hours you will need to follow up with your provider, or enter a new e-Visit to address those concerns. You will get an e-mail in the next two days asking about your experience.  I hope that your e-visit has been valuable and will speed your recovery. Thank you for using e-visits.  Approximately 5 minutes was spent documenting and reviewing patient's chart.

## 2019-03-11 NOTE — Progress Notes (Signed)
   Subjective:  Documentation for virtual audio through [employer] encounter: He was unable to get his camera to work.  The patient was located at home. 2 patient identifiers used.  The provider was located in the office. The patient did consent to this visit and is aware of possible charges through their insurance for this visit.  The other persons participating in this telemedicine service were none.    Patient ID: Kyle Patton, male    DOB: 11/19/1975, 44 y.o.   MRN: [id number]  HPI Chief Complaint  Patient presents with  . exposure to covid    exposure to covid-- no having any symptoms- wife was saturday morning   States his wife was diagnosed with Covid-19 on Saturday 03/07/2019. He also thinks that was her day of symptom onset.  States he feels fine and no symptoms.   Denies fever, chills, body aches, URI symptoms, shortness of breath, cough, abdominal pain, N/V/D.  No loss of taste or smell   States he works at [employer].    Reviewed allergies, medications, past medical, surgical, family, and social history.    Review of Systems Pertinent positives and negatives in the history of present illness.     Objective:   Physical Exam Wt 230 lb (104.3 kg)   BMI 37.12 kg/m   Alert and oriented and in no acute distress.  Respirations unlabored and speaking in complete sentences without difficulty.  Normal speech, mood and thought process.      Assessment & Plan:  Close exposure to COVID-19 virus  Household exposure to COVID-19 on Saturday, 03/07/2019 and he is asymptomatic. Counseling on quarantining and getting tested on day 5.  He will text [facility] to schedule this.  Discussed that if he is negative that he can go back to work on day 7 as long as he is still asymptomatic. He will need a work note sent to [employer] and we will take care of this.  Time spent on call was 11 minutes and in review of previous records 15 minutes  total.  This virtual service is not related to other E/M service within previous 7 days.

## 2019-03-12 ENCOUNTER — Ambulatory Visit: Payer: BLUE CROSS/BLUE SHIELD | Attending: Internal Medicine

## 2019-03-12 DIAGNOSIS — Z20822 Contact with and (suspected) exposure to covid-19: Secondary | ICD-10-CM | POA: Diagnosis not present

## 2019-03-13 LAB — NOVEL CORONAVIRUS, NAA: SARS-CoV-2, NAA: NOT DETECTED

## 2019-03-17 ENCOUNTER — Encounter: Payer: Self-pay | Admitting: Family Medicine

## 2020-03-01 ENCOUNTER — Telehealth: Payer: BC Managed Care – PPO | Admitting: Family Medicine

## 2020-03-01 NOTE — Progress Notes (Signed)
   Subjective:    Patient ID: Kyle Patton, male    DOB: 1975-08-20, 45 y.o.   MRN: [id number]  HPI  He did not answer his phone on several attempts made by my assistant, Martie Lee, CMA.    Review of Systems     Objective:   Physical Exam        Assessment & Plan:

## 2020-03-02 ENCOUNTER — Other Ambulatory Visit: Payer: Self-pay

## 2020-03-02 ENCOUNTER — Telehealth (INDEPENDENT_AMBULATORY_CARE_PROVIDER_SITE_OTHER): Payer: BC Managed Care – PPO | Admitting: Family Medicine

## 2020-03-02 ENCOUNTER — Encounter: Payer: Self-pay | Admitting: Family Medicine

## 2020-03-02 VITALS — Wt 235.0 lb

## 2020-03-02 DIAGNOSIS — R0981 Nasal congestion: Secondary | ICD-10-CM

## 2020-03-02 DIAGNOSIS — R6883 Chills (without fever): Secondary | ICD-10-CM

## 2020-03-02 DIAGNOSIS — U071 COVID-19: Secondary | ICD-10-CM

## 2020-03-02 DIAGNOSIS — R059 Cough, unspecified: Secondary | ICD-10-CM

## 2020-03-02 NOTE — Progress Notes (Signed)
   Subjective:  Documentation for virtual audio and video telecommunications through Caregility encounter:  The patient was located at home. 2 patient identifiers used.  The provider was located in the office. The patient did consent to this visit and is aware of possible charges through their insurance for this visit.  The other persons participating in this telemedicine service were none. Time spent on call was 14 minutes and in review of previous records 20 minutes total.  This virtual service is not related to other E/M service within previous 7 days.   Patient ID: Kyle Patton, male    DOB: 04/14/1975, 45 y.o.   MRN: [id number]  HPI Chief Complaint  Patient presents with  . Covid Positive    Positive covid- home test- Saturday, symptoms- body aches,coughing up phelgm, sinus pressure, headache   Complains of a 7 day history of URI symptoms. States he had a dry cough, sinus pressure and headache.  Positive home Covid test 4 days ago.   Having some mild sinus congestion and a mild dry cough. States he had chills yesterday for 3 hours.   States he had 2 Pfizer Covid vaccines. No booster.   He works at [employer].   No known fever, dizziness, chest pain, shortness of breath, abdominal pain, N/V/D.    Review of Systems Pertinent positives and negatives in the history of present illness.     Objective:   Physical Exam Wt 235 lb (106.6 kg)   BMI 37.93 kg/m   Alert and oriented and in no acute distress.  Respirations unlabored.  Speaking in complete sentences without difficulty.      Assessment & Plan:  COVID-19 virus infection  Cough  Nasal congestion  Chills  Discussed that this is day 7 since onset of symptoms.  Since he still had chills yesterday and is still quite symptomatic, I recommend that he quarantine for the full 10 days which will be this weekend.  Discussed symptomatic treatment.  He and I will do a virtual visit Monday morning at  8:15 AM to determine whether he is well enough to return to work.

## 2020-03-07 ENCOUNTER — Encounter: Payer: Self-pay | Admitting: Family Medicine

## 2020-03-07 ENCOUNTER — Other Ambulatory Visit: Payer: Self-pay

## 2020-03-07 ENCOUNTER — Encounter: Payer: Self-pay | Admitting: Internal Medicine

## 2020-03-07 ENCOUNTER — Telehealth (INDEPENDENT_AMBULATORY_CARE_PROVIDER_SITE_OTHER): Payer: BC Managed Care – PPO | Admitting: Family Medicine

## 2020-03-07 ENCOUNTER — Telehealth: Payer: Self-pay | Admitting: Internal Medicine

## 2020-03-07 VITALS — Wt 235.0 lb

## 2020-03-07 DIAGNOSIS — U071 COVID-19: Secondary | ICD-10-CM

## 2020-03-07 DIAGNOSIS — R059 Cough, unspecified: Secondary | ICD-10-CM | POA: Diagnosis not present

## 2020-03-07 NOTE — Telephone Encounter (Signed)
error 

## 2020-03-07 NOTE — Progress Notes (Signed)
   Subjective:  Documentation for virtual audio and video telecommunications through Caregility encounter:  The patient was located at home. 2 patient identifiers used.  The provider was located in the office. The patient did consent to this visit and is aware of possible charges through their insurance for this visit.  The other persons participating in this telemedicine service were none. Time spent on call was 8 minutes and in review of previous records 10 minutes total.  This virtual service is related to other E/M service within previous 7 days.   Patient ID: Kyle Patton, male    DOB: 05/26/1975, 45 y.o.   MRN: [id number]  HPI Chief Complaint  Patient presents with  . Follow-up   This is a follow up visit for Covid infection. States he feels 90% better than when we last talked on the 03/03/2019. Reports his only symptom is a lingering dry cough.   States he feels well enough to go back to work.   Denies fever, chills, chest pain, palpitations, shortness of breath, abdominal pain, N/V/D.   He has had 2 Pfizer vaccines.   Reviewed allergies, medications, past medical, surgical, family, and social history.   Review of Systems Pertinent positives and negatives in the history of present illness.     Objective:   Physical Exam Wt 235 lb (106.6 kg)   BMI 37.93 kg/m   Alert and oriented and in no acute distress. Respirations unlabored. Normal speech.       Assessment & Plan:  COVID-19 virus infection  Cough  He is recovering well and basically back to baseline. Discussed continuing to treat cough with lozenges and continue taking vitamins.  He may return to work tomorrow.

## 2020-04-01 ENCOUNTER — Encounter: Payer: BC Managed Care – PPO | Admitting: Family Medicine

## 2020-06-16 ENCOUNTER — Ambulatory Visit (INDEPENDENT_AMBULATORY_CARE_PROVIDER_SITE_OTHER): Payer: BC Managed Care – PPO | Admitting: Family Medicine

## 2020-06-16 ENCOUNTER — Other Ambulatory Visit: Payer: Self-pay

## 2020-06-16 ENCOUNTER — Encounter: Payer: Self-pay | Admitting: Family Medicine

## 2020-06-16 VITALS — BP 130/90 | HR 85 | Ht 66.5 in | Wt 239.4 lb

## 2020-06-16 DIAGNOSIS — Z0000 Encounter for general adult medical examination without abnormal findings: Secondary | ICD-10-CM

## 2020-06-16 DIAGNOSIS — K219 Gastro-esophageal reflux disease without esophagitis: Secondary | ICD-10-CM

## 2020-06-16 DIAGNOSIS — E669 Obesity, unspecified: Secondary | ICD-10-CM

## 2020-06-16 DIAGNOSIS — Z1159 Encounter for screening for other viral diseases: Secondary | ICD-10-CM | POA: Diagnosis not present

## 2020-06-16 DIAGNOSIS — Z9989 Dependence on other enabling machines and devices: Secondary | ICD-10-CM

## 2020-06-16 DIAGNOSIS — M546 Pain in thoracic spine: Secondary | ICD-10-CM

## 2020-06-16 DIAGNOSIS — Z1211 Encounter for screening for malignant neoplasm of colon: Secondary | ICD-10-CM | POA: Diagnosis not present

## 2020-06-16 DIAGNOSIS — Z125 Encounter for screening for malignant neoplasm of prostate: Secondary | ICD-10-CM | POA: Diagnosis not present

## 2020-06-16 DIAGNOSIS — G4733 Obstructive sleep apnea (adult) (pediatric): Secondary | ICD-10-CM | POA: Diagnosis not present

## 2020-06-16 DIAGNOSIS — R7303 Prediabetes: Secondary | ICD-10-CM | POA: Diagnosis not present

## 2020-06-16 NOTE — Progress Notes (Signed)
Subjective:    Patient ID: Kyle Patton, male    DOB: [birth year], 45 y.o.   MRN: [id number]  HPI Chief Complaint  Patient presents with  . fasting cpe    Fasting cpe, back mid pain when moving muscle- when taking a deep breath, he can feel it.    He is here for a complete physical exam. Last CPE: 2019  Upper back pain that is described as soreness after lifting boxes and moving over the weekend.   Prediabetes - states he has cut out bread and other carbs.  Occasional bloating after eating  Family history of diabetes.  Interested in trying Ozempic.   GERD- takes omeprazole some days.   OSA- using CPAP nightly and doing well   Social history: Lives with wife and 2 kids, works works as a Sports coach at [employer]  Denies smoking and drug use. drinking alcohol socially  Diet: healthier  Exercise: walks a lot   Immunizations: He had Covid in early 2022. Has had 2 Covid vaccines   Health maintenance:  Colonoscopy: never and mother with polyps  Last PSA: never  Last Dental Exam: [facility] [address] last year  Last Eye Exam: in the past 3 months   Wears seatbelt always, smoke detectors in home and functioning, does not text while driving, feels safe in home environment.  Reviewed allergies, medications, past medical, surgical, family, and social history.   Review of Systems Review of Systems Constitutional: -fever, -chills, -sweats, -unexpected weight change,-fatigue ENT: -runny nose, -ear pain, -sore throat Cardiology:  -chest pain, -palpitations, -edema Respiratory: -cough, -shortness of breath, -wheezing Gastroenterology: -abdominal pain, -nausea, -vomiting, -diarrhea, -constipation  Hematology: -bleeding or bruising problems Musculoskeletal: -arthralgias, -myalgias, -joint swelling, +back pain Ophthalmology: -vision changes Urology: -dysuria, -difficulty urinating, -hematuria, -urinary frequency, -urgency Neurology: -headache, -weakness,  -tingling, -numbness       Objective:   Physical Exam BP 130/90   Pulse 85   Ht 5' 6.5" (1.689 m)   Wt 239 lb 6.4 oz (108.6 kg)   BMI 38.06 kg/m   General Appearance:    Alert, cooperative, no distress, appears stated age  Head:    Normocephalic, without obvious abnormality, atraumatic  Eyes:    PERRL, conjunctiva/corneas clear, EOM's intact  Ears:    Normal TM's and external ear canals  Nose:   Mask on   Throat:   Mask on   Neck:   Supple, no lymphadenopathy;  thyroid:  no   enlargement/tenderness/nodules; no JVD  Back:    Spine nontender, no curvature, ROM normal, TTP over left thoracic paraspinal muscles,  no CVA tenderness  Lungs:     Clear to auscultation bilaterally without wheezes, rales or     ronchi; respirations unlabored  Chest Wall:    No tenderness or deformity   Heart:    Regular rate and rhythm, S1 and S2 normal, no murmur, rub   or gallop  Breast Exam:    No chest wall tenderness, masses or gynecomastia  Abdomen:     Soft, non-tender, nondistended, normoactive bowel sounds,    no masses, no hepatosplenomegaly  Genitalia:    Declines      Extremities:   No clubbing, cyanosis or edema  Pulses:   2+ and symmetric all extremities  Skin:   Skin color, texture, turgor normal, no rashes or lesions  Lymph nodes:   Cervical, supraclavicular, and axillary nodes normal  Neurologic:   CNII-XII intact, normal strength, sensation and gait  Psych:   Normal mood, affect, hygiene and grooming.         Assessment & Plan:  Routine general medical examination at a health care facility - Plan: CBC with Differential/Platelet, Comprehensive metabolic panel, TSH, T4, free, T3, Lipid panel -Preventive health care reviewed.  It is time for his for screening colonoscopy and he is aware.  Referral to GI made.  We also discussed prostate cancer screening and he declines DRE but is in favor of PSA blood test.  Counseling on healthy lifestyle including diet and exercise.  Recommend  regular dental and eye exams.  He is in good spirits.  Immunizations reviewed.  He may return for his COVID booster if he would like at some point.  Discussed safety.  Prediabetes - Plan: Comprehensive metabolic panel, TSH, T4, free, T3, Hemoglobin A1c -Congratulated him on making healthy dietary changes.  He is aware of his increased risk of developing diabetes and we discussed weight loss for this.  He also is considering trying Ozempic and we will check his lab results before discussing this further.  Obesity (BMI 30-39.9) - Plan: Comprehensive metabolic panel, TSH, T4, free, T3, Lipid panel -Counseling on healthy diet and exercise  Gastroesophageal reflux disease, unspecified whether esophagitis present - Plan: Ambulatory referral to Gastroenterology -GERD is fairly well controlled now with lifestyle management.  Continue taking PPI as needed.  He will discuss this with GI as well.  Need for hepatitis C screening test - Plan: Hepatitis C antibody -Done per screening guidelines  OSA on CPAP -Reports good compliance, nightly use of CPAP and feels that he is benefiting from it.  I will request his compliance report.  Screen for colon cancer - Plan: Ambulatory referral to Gastroenterology  Screening for prostate cancer - Plan: PSA  Acute left-sided thoracic back pain -Musculoskeletal muscle soreness due to heavy lifting over the weekend.  Discussed conservative treatment including NSAIDs, ice or heat and topical analgesic.  He is aware that this should improve over the next few days.

## 2020-06-16 NOTE — Patient Instructions (Signed)

## 2020-06-17 ENCOUNTER — Encounter: Payer: Self-pay | Admitting: Internal Medicine

## 2020-06-17 ENCOUNTER — Encounter: Payer: Self-pay | Admitting: Family Medicine

## 2020-06-17 LAB — HEMOGLOBIN A1C
Est. average glucose Bld gHb Est-mCnc: 126 mg/dL
Hgb A1c MFr Bld: 6 % — ABNORMAL HIGH (ref 4.8–5.6)

## 2020-06-17 LAB — COMPREHENSIVE METABOLIC PANEL
ALT: 36 IU/L (ref 0–44)
AST: 18 IU/L (ref 0–40)
Albumin/Globulin Ratio: 1.7 (ref 1.2–2.2)
Albumin: 4.7 g/dL (ref 4.0–5.0)
Alkaline Phosphatase: 113 IU/L (ref 44–121)
BUN/Creatinine Ratio: 10 (ref 9–20)
BUN: 12 mg/dL (ref 6–24)
Bilirubin Total: 0.4 mg/dL (ref 0.0–1.2)
CO2: 20 mmol/L (ref 20–29)
Calcium: 9.6 mg/dL (ref 8.7–10.2)
Chloride: 101 mmol/L (ref 96–106)
Creatinine, Ser: 1.17 mg/dL (ref 0.76–1.27)
Globulin, Total: 2.7 g/dL (ref 1.5–4.5)
Glucose: 103 mg/dL — ABNORMAL HIGH (ref 65–99)
Potassium: 4.7 mmol/L (ref 3.5–5.2)
Sodium: 141 mmol/L (ref 134–144)
Total Protein: 7.4 g/dL (ref 6.0–8.5)
eGFR: 78 mL/min/{1.73_m2} (ref >59)

## 2020-06-17 LAB — CBC WITH DIFFERENTIAL/PLATELET
Basophils Absolute: 0 10*3/uL (ref 0.0–0.2)
Basos: 1 %
EOS (ABSOLUTE): 0.2 10*3/uL (ref 0.0–0.4)
Eos: 3 %
Hematocrit: 46.7 % (ref 37.5–51.0)
Hemoglobin: 15.4 g/dL (ref 13.0–17.7)
Immature Grans (Abs): 0 10*3/uL (ref 0.0–0.1)
Immature Granulocytes: 0 %
Lymphocytes Absolute: 1.9 10*3/uL (ref 0.7–3.1)
Lymphs: 40 %
MCH: 30.7 pg (ref 26.6–33.0)
MCHC: 33 g/dL (ref 31.5–35.7)
MCV: 93 fL (ref 79–97)
Monocytes Absolute: 0.4 10*3/uL (ref 0.1–0.9)
Monocytes: 9 %
Neutrophils Absolute: 2.2 10*3/uL (ref 1.4–7.0)
Neutrophils: 47 %
Platelets: 224 10*3/uL (ref 150–450)
RBC: 5.01 x10E6/uL (ref 4.14–5.80)
RDW: 12.2 % (ref 11.6–15.4)
WBC: 4.8 10*3/uL (ref 3.4–10.8)

## 2020-06-17 LAB — LIPID PANEL
Chol/HDL Ratio: 5.2 ratio — ABNORMAL HIGH (ref 0.0–5.0)
Cholesterol, Total: 214 mg/dL — ABNORMAL HIGH (ref 100–199)
HDL: 41 mg/dL (ref >39)
LDL Chol Calc (NIH): 162 mg/dL — ABNORMAL HIGH (ref 0–99)
Triglycerides: 60 mg/dL (ref 0–149)
VLDL Cholesterol Cal: 11 mg/dL (ref 5–40)

## 2020-06-17 LAB — PSA: Prostate Specific Ag, Serum: 1.1 ng/mL (ref 0.0–4.0)

## 2020-06-17 LAB — T3: T3, Total: 127 ng/dL (ref 71–180)

## 2020-06-17 LAB — TSH: TSH: 2.04 u[IU]/mL (ref 0.450–4.500)

## 2020-06-17 LAB — T4, FREE: Free T4: 1.14 ng/dL (ref 0.82–1.77)

## 2020-06-17 LAB — HEPATITIS C ANTIBODY: Hep C Virus Ab: <0.1 s/co ratio (ref 0.0–0.9)

## 2020-06-17 MED ORDER — OZEMPIC (0.25 OR 0.5 MG/DOSE) 2 MG/1.5ML ~~LOC~~ SOPN: PEN_INJECTOR | SUBCUTANEOUS | 1 refills | Status: DC

## 2020-06-23 ENCOUNTER — Telehealth: Payer: Self-pay

## 2020-06-23 NOTE — Telephone Encounter (Signed)
Pt states insurance isn't paying for Ozempic and cost $800 a month.  Advised needs prior authorization.

## 2020-06-25 NOTE — Telephone Encounter (Signed)
P.A. OZEMPIC completed 

## 2020-07-01 NOTE — Telephone Encounter (Signed)
P.A. denied, pt needs dx of type 2 diabetes and tried and failed Metformin.  Do you want to switch to something different ?

## 2020-07-05 NOTE — Telephone Encounter (Signed)
He can try oral metformin if he would like.

## 2020-07-06 NOTE — Telephone Encounter (Signed)
Called pt and he states Ozempic sample going well, he likes it, has little side effects of nausea.  Doesn't think he wants to try Metformin.  He will schedule a follow up appt to discuss options with Vickie once he finishes with the sample.

## 2020-08-04 ENCOUNTER — Telehealth: Payer: Self-pay

## 2020-08-04 NOTE — Telephone Encounter (Signed)
Pt called and states Ozempic working very well and wants to stay on it but insurance will not pay

## 2020-08-04 NOTE — Telephone Encounter (Signed)
Continued...Marland KitchenMarland KitchenMarland Kitchen insurance will not cover Ozempic, P.A. denied, will only cover Type 2 diabetes and after trial of Metformin.   First question is patient would really like another sample, he states it is working so well and he really doesn't want to stop it.   It is helping with weight loss and his sleep apnea and many things.   2nd do you want me to try and see if I can get the new medication Mounjaro approved instead?

## 2020-08-05 NOTE — Telephone Encounter (Signed)
Ozempic sample given to patient and he wants to try [facility] if covered

## 2020-08-16 MED ORDER — TIRZEPATIDE 5 MG/0.5ML ~~LOC~~ SOAJ: 5.0000 mg | SUBCUTANEOUS | 3 refills | Status: DC

## 2020-08-16 NOTE — Telephone Encounter (Addendum)
Pt informed

## 2020-08-16 NOTE — Telephone Encounter (Signed)
Called pt and left message to inform about new medication change & see if he is willing to try.  Also called rep for sample Mounjaro first month.  Sent in 2nd month dosing and faxed discount card.

## 2020-09-30 ENCOUNTER — Telehealth: Payer: Self-pay

## 2020-09-30 NOTE — Telephone Encounter (Signed)
Went ahead and completed P.A. MOUNJARO Pt stopped by office and having issues at pharmacy getting [facility], called pharmacy spoke with Lurena Joiner and she processed with discount card and went thru for $25 they are ordering and will be in Monday, pt informed

## 2020-10-29 NOTE — Telephone Encounter (Signed)
done

## 2020-12-16 ENCOUNTER — Other Ambulatory Visit: Payer: Self-pay

## 2020-12-16 ENCOUNTER — Emergency Department (HOSPITAL_BASED_OUTPATIENT_CLINIC_OR_DEPARTMENT_OTHER)
Admission: EM | Admit: 2020-12-16 | Discharge: 2020-12-16 | Disposition: A | Discharge status: Home / Self Care | Payer: BC Managed Care – PPO | Attending: Emergency Medicine | Admitting: Emergency Medicine

## 2020-12-16 DIAGNOSIS — J45909 Unspecified asthma, uncomplicated: Secondary | ICD-10-CM | POA: Insufficient documentation

## 2020-12-16 DIAGNOSIS — Z87891 Personal history of nicotine dependence: Secondary | ICD-10-CM | POA: Diagnosis not present

## 2020-12-16 DIAGNOSIS — U071 COVID-19: Secondary | ICD-10-CM | POA: Insufficient documentation

## 2020-12-16 DIAGNOSIS — R059 Cough, unspecified: Secondary | ICD-10-CM | POA: Diagnosis not present

## 2020-12-16 LAB — RESP PANEL BY RT-PCR (FLU A&B, COVID) ARPGX2
Influenza A by PCR: NEGATIVE
Influenza B by PCR: NEGATIVE
SARS Coronavirus 2 by RT PCR: POSITIVE — AB

## 2020-12-16 NOTE — ED Notes (Signed)
Pt verbalizes understanding of discharge instructions. Opportunity for questioning and answers were provided. Armand removed by staff, pt discharged from ED to home. Work noted given to patient.

## 2020-12-16 NOTE — Discharge Instructions (Signed)
If you had any testing done today, the results will show up on your MyChart phone app in 24 hours.  Call your primary care doctor in the next 1-2 days to arrange video follow-up.   Use a finger pulse oximeter at home.  You may purchase one at [facility] or [facility] or online.  If the numbers drops and stays below 90%, return immediately back to the ER.  Otherwise increase your fluid intake, isolate at home for 5 days if symptoms have resolved, and inform recent close contacts of the need to test for Covid if they develop symptoms.  

## 2020-12-16 NOTE — ED Triage Notes (Signed)
Pt reports being dx with COVID-19 on Sunday 10/30. Needs to be tested again to go back to work. Denies current fever, myalgias. Reports mild non productive cough.

## 2020-12-16 NOTE — ED Provider Notes (Signed)
[facility] EMERGENCY DEPT Provider Note   CSN: [id number] Arrival date & time: 12/16/20  1857     History Chief Complaint  Patient presents with   covid test    Kyle Patton is a 45 y.o. male.  Patient presents with chief complaint of wanting to know his COVID status.  He states he had a cough about 10 days ago which is greatly improved but still has a mild lingering cough.  He tested positive for COVID December 11, 2020.  He says his work requires a negative COVID test.  Otherwise denies any shortness of breath denies any fevers denies vomiting or diarrhea.      Past Medical History:  Diagnosis Date   Acid reflux    Asthma    ED (erectile dysfunction) 06/02/2018   Sleep apnea    PT uses C=PAP   TB (tuberculosis), treated     Patient Active Problem List   Diagnosis Date Noted   ED (erectile dysfunction) 06/02/2018   Nocturnal hypoxemia 03/12/2018   Prediabetes 02/19/2018   Elevated LDL cholesterol level 02/19/2018   Family history of diabetes mellitus in brother 01/31/2018   Obesity (BMI 30-39.9) 01/31/2018   Sleep apnea    Gastroesophageal reflux disease    TB (tuberculosis), treated     No past surgical history on file.     Family History  Problem Relation Age of Onset   Breast cancer Mother    Hypertension Mother    Hyperlipidemia Mother    Diabetes Maternal Grandmother    Diabetes Brother     Social History   Tobacco Use   Smoking status: Former    Packs/day: 0.50    Years: 10.00    Pack years: 5.00    Types: Cigarettes    Quit date: 08/13/2007    Years since quitting: 13.3   Smokeless tobacco: Never  Vaping Use   Vaping Use: Never used  Substance Use Topics   Alcohol use: Yes    Alcohol/week: 2.0 standard drinks    Types: 2 Standard drinks or equivalent per week    Comment: weekly    Drug use: No    Home Medications Prior to Admission medications   Medication Sig Start Date End Date Taking? Authorizing Provider   omeprazole (PRILOSEC) 20 MG capsule Take 20 mg by mouth daily.    [provider]  tirzepatide Greggory Keen) 5 MG/0.5ML Pen Inject 5 mg into the skin once a week. 08/16/20   Ronnald Nian, MD    Allergies    Patient has no known allergies.  Review of Systems   Review of Systems  Constitutional:  Negative for fever.  HENT:  Negative for ear pain and sore throat.   Eyes:  Negative for pain.  Respiratory:  Positive for cough.   Cardiovascular:  Negative for chest pain.  Gastrointestinal:  Negative for abdominal pain.  Genitourinary:  Negative for flank pain.  Musculoskeletal:  Negative for back pain.  Skin:  Negative for color change and rash.  Neurological:  Negative for syncope.  All other systems reviewed and are negative.  Physical Exam Updated Vital Signs Pulse 78   Temp 98.6 F (37 C) (Oral)   Resp 15   Ht 5\' 6"  (1.676 m)   Wt 86.2 kg   SpO2 100%   BMI 30.67 kg/m   Physical Exam Constitutional:      Appearance: He is well-developed.  HENT:     Head: Normocephalic.     Nose: Nose  normal.  Eyes:     Extraocular Movements: Extraocular movements intact.  Cardiovascular:     Rate and Rhythm: Normal rate.  Pulmonary:     Effort: Pulmonary effort is normal.  Skin:    Coloration: Skin is not jaundiced.  Neurological:     Mental Status: He is alert. Mental status is at baseline.    ED Results / Procedures / Treatments   Labs (all labs ordered are listed, but only abnormal results are displayed) Labs Reviewed  RESP PANEL BY RT-PCR (FLU A&B, COVID) ARPGX2 - Abnormal; Notable for the following components:      Result Value   SARS Coronavirus 2 by RT PCR POSITIVE (*)    All other components within normal limits    EKG None  Radiology No results found.  Procedures Procedures   Medications Ordered in ED Medications - No data to display  ED Course  I have reviewed the triage vital signs and the nursing notes.  Pertinent labs & imaging results  that were available during my care of the patient were reviewed by me and considered in my medical decision making (see chart for details).    MDM Rules/Calculators/A&P                           Patient remains positive for PCR COVID test today.  Recommend follow-up with his doctor as needed within the week.  Recommend return back to the ER if he has difficulty breathing or any additional concerns.  Final Clinical Impression(s) / ED Diagnoses Final diagnoses:  COVID-19 virus infection    Rx / DC Orders ED Discharge Orders     None        Cheryll Cockayne, MD 12/16/20 2107

## 2021-05-26 ENCOUNTER — Other Ambulatory Visit: Payer: Self-pay

## 2021-05-26 ENCOUNTER — Telehealth: Payer: Self-pay | Admitting: Physician Assistant

## 2021-05-26 DIAGNOSIS — R7303 Prediabetes: Secondary | ICD-10-CM

## 2021-05-26 MED ORDER — TIRZEPATIDE 5 MG/0.5ML ~~LOC~~ SOAJ: 5.0000 mg | SUBCUTANEOUS | 3 refills | Status: DC

## 2021-05-26 NOTE — Telephone Encounter (Signed)
Pt called requesting refill on Mounjaro ? ?[facility] ?

## 2021-06-19 ENCOUNTER — Encounter: Payer: Self-pay | Admitting: Physician Assistant

## 2021-06-19 ENCOUNTER — Ambulatory Visit: Payer: Managed Care, Other (non HMO) | Admitting: Physician Assistant

## 2021-06-19 VITALS — BP 130/90 | HR 72 | Ht 66.0 in | Wt 187.0 lb

## 2021-06-19 DIAGNOSIS — E669 Obesity, unspecified: Secondary | ICD-10-CM | POA: Insufficient documentation

## 2021-06-19 DIAGNOSIS — E782 Mixed hyperlipidemia: Secondary | ICD-10-CM

## 2021-06-19 DIAGNOSIS — Z0000 Encounter for general adult medical examination without abnormal findings: Secondary | ICD-10-CM | POA: Diagnosis not present

## 2021-06-19 DIAGNOSIS — R7303 Prediabetes: Secondary | ICD-10-CM | POA: Diagnosis not present

## 2021-06-19 DIAGNOSIS — E6609 Other obesity due to excess calories: Secondary | ICD-10-CM

## 2021-06-19 DIAGNOSIS — Z1211 Encounter for screening for malignant neoplasm of colon: Secondary | ICD-10-CM

## 2021-06-19 DIAGNOSIS — Z6830 Body mass index (BMI) 30.0-30.9, adult: Secondary | ICD-10-CM

## 2021-06-19 LAB — CBC WITH DIFFERENTIAL/PLATELET
Basophils Absolute: 0 10*3/uL (ref 0.0–0.2)
Basos: 1 %
EOS (ABSOLUTE): 0.1 10*3/uL (ref 0.0–0.4)
Eos: 2 %
Hematocrit: 42.3 % (ref 37.5–51.0)
Hemoglobin: 14.5 g/dL (ref 13.0–17.7)
Immature Grans (Abs): 0 10*3/uL (ref 0.0–0.1)
Immature Granulocytes: 0 %
Lymphocytes Absolute: 1.9 10*3/uL (ref 0.7–3.1)
Lymphs: 40 %
MCH: 31.5 pg (ref 26.6–33.0)
MCHC: 34.3 g/dL (ref 31.5–35.7)
MCV: 92 fL (ref 79–97)
Monocytes Absolute: 0.4 10*3/uL (ref 0.1–0.9)
Monocytes: 8 %
Neutrophils Absolute: 2.3 10*3/uL (ref 1.4–7.0)
Neutrophils: 49 %
Platelets: 195 10*3/uL (ref 150–450)
RBC: 4.6 x10E6/uL (ref 4.14–5.80)
RDW: 12.2 % (ref 11.6–15.4)
WBC: 4.7 10*3/uL (ref 3.4–10.8)

## 2021-06-19 LAB — COMPREHENSIVE METABOLIC PANEL
ALT: 15 IU/L (ref 0–44)
AST: 17 IU/L (ref 0–40)
Albumin/Globulin Ratio: 1.8 (ref 1.2–2.2)
Albumin: 4.7 g/dL (ref 4.0–5.0)
Alkaline Phosphatase: 94 IU/L (ref 44–121)
BUN/Creatinine Ratio: 14 (ref 9–20)
BUN: 16 mg/dL (ref 6–24)
Bilirubin Total: 0.4 mg/dL (ref 0.0–1.2)
CO2: 22 mmol/L (ref 20–29)
Calcium: 9.9 mg/dL (ref 8.7–10.2)
Chloride: 102 mmol/L (ref 96–106)
Creatinine, Ser: 1.17 mg/dL (ref 0.76–1.27)
Globulin, Total: 2.6 g/dL (ref 1.5–4.5)
Glucose: 91 mg/dL (ref 70–99)
Potassium: 4.3 mmol/L (ref 3.5–5.2)
Sodium: 142 mmol/L (ref 134–144)
Total Protein: 7.3 g/dL (ref 6.0–8.5)
eGFR: 78 mL/min/{1.73_m2} (ref >59)

## 2021-06-19 LAB — LIPID PANEL
Chol/HDL Ratio: 3.4 ratio (ref 0.0–5.0)
Cholesterol, Total: 182 mg/dL (ref 100–199)
HDL: 54 mg/dL (ref >39)
LDL Chol Calc (NIH): 117 mg/dL — ABNORMAL HIGH (ref 0–99)
Triglycerides: 55 mg/dL (ref 0–149)
VLDL Cholesterol Cal: 11 mg/dL (ref 5–40)

## 2021-06-19 LAB — HEMOGLOBIN A1C
Est. average glucose Bld gHb Est-mCnc: 114 mg/dL
Hgb A1c MFr Bld: 5.6 % (ref 4.8–5.6)

## 2021-06-19 NOTE — Assessment & Plan Note (Signed)
controlled, eat a low sugar diet, avoid starchy food with a lot of carbohydrates, avoid fried and processed foods; last hgb a1c 6.0 on 06/16/2020 ?

## 2021-06-19 NOTE — Assessment & Plan Note (Signed)
controlled, eat a low fat diet, increase fiber intake (Benefiber or Metamucil, Cherrios,  oatmeal, beans, nuts, fruits and vegetables), limit saturated fats (in fried foods, red meat), can add OTC fish oil supplement, eat fish with Omega-3 fatty acids like salmon and tuna, exercise for 30 minutes 3 - 5 times a week, drink 8 - 10 glasses of water a day ? ? ?

## 2021-06-19 NOTE — Patient Instructions (Addendum)
Preventative Care for Adults, Male ?   ?   REGULAR HEALTH EXAMS: ?A routine yearly physical is a good way to check in with your primary care provider about your health and preventive screening. It is also an opportunity to share updates about your health and any concerns you have, and receive a thorough all-over exam.  ?Most health insurance companies pay for at least some preventative services.  Check with your health plan for specific coverages. ? ?WHAT PREVENTATIVE SERVICES DO MEN NEED? ?Adult men should have their weight and blood pressure checked regularly.  ?Men age 35 and older should have their cholesterol levels checked regularly. ?Beginning at age 47 and continuing to age 75, men should be screened for colorectal cancer.  Certain people should may need continued testing until age 85. ?Other cancer screening may include exams for testicular and prostate cancer. ?Updating vaccinations is part of preventative care.  Vaccinations help protect against diseases such as the flu. ?Lab tests are generally done as part of preventative care to screen for anemia and blood disorders, to screen for problems with the kidneys and liver, to screen for bladder problems, to check blood sugar, and to check your cholesterol level. ?Preventative services generally include counseling about diet, exercise, avoiding tobacco, drugs, excessive alcohol consumption, and sexually transmitted infections.   ? ?GENERAL RECOMMENDATIONS FOR GOOD HEALTH: ? ?Healthy diet: ?Eat a variety of foods, including fruit, vegetables, animal or vegetable protein, such as meat, fish, chicken, and eggs, or beans, lentils, tofu, and grains, such as rice. ?Drink plenty of water daily (60 - 80 ounces or 8 - 10 glasses of water a day) ?Decrease saturated fat in the diet, avoid lots of red meat, processed foods, sweets, fast foods, and fried foods. For high cholesterol - Increase fiber intake (Benefiber or Metamucil, Cherrios,  oatmeal, beans, nuts, fruits  and vegetables), limit saturated fats (in fried foods, red meat), can add OTC fish oil supplement, eat fish with Omega-3 fatty acids like salmon and tuna, exercise for 30 minutes 3 - 5 times a week, drink 8 - 10 glasses of water a day. ? ?Exercise: ?Aerobic exercise helps maintain good heart health. At least 30-40 minutes of moderate-intensity exercise is recommended. For example, a brisk walk that increases your heart rate and breathing. This should be done on most days of the week.  ?Find a type of exercise or a variety of exercises that you enjoy so that it becomes a part of your daily life.  Examples are running, walking, swimming, water aerobics, and biking.  For motivation and support, explore group exercise such as aerobic class, spin class, Zumba, Yoga,or  martial arts, etc.   ?Set exercise goals for yourself, such as a certain weight goal, walk or run in a race such as a 5k walk/run.  Speak to your primary care provider about exercise goals. ? ?Disease prevention: ?If you smoke or chew tobacco, find out from your caregiver how to quit. It can literally save your life, no matter how long you have been a tobacco user. If you do not use tobacco, never begin.  ?Maintain a healthy diet and normal weight. Increased weight leads to problems with blood pressure and diabetes.  ?The Body Mass Index or BMI is a way of measuring how much of your body is fat. Having a BMI above 27 increases the risk of heart disease, diabetes, hypertension, stroke and other problems related to obesity. Your caregiver can help determine your BMI and based on it develop   an exercise and dietary program to help you achieve or maintain this important measurement at a healthful level. ?High blood pressure causes heart and blood vessel problems.  Persistent high blood pressure should be treated with medicine if weight loss and exercise do not work.  ?Fat and cholesterol leaves deposits in your arteries that can block them. This causes heart  disease and vessel disease elsewhere in your body.  If your cholesterol is found to be high, or if you have heart disease or certain other medical conditions, then you may need to have your cholesterol monitored frequently and be treated with medication.  ?Ask if you should have a stress test if your history suggests this. A stress test is a test done on a treadmill that looks for heart disease. This test can find disease prior to there being a problem. ?Avoid drinking alcohol in excess (more than two drinks per day).  Avoid use of street drugs. Do not share needles with anyone. Ask for professional help if you need assistance or instructions on stopping the use of alcohol, cigarettes, and/or drugs. ?Brush your teeth twice a day with fluoride toothpaste, and floss once a day. Good oral hygiene prevents tooth decay and gum disease. The problems can be painful, unattractive, and can cause other health problems. Visit your dentist for a routine oral and dental check up and preventive care every 6-12 months.  ?Look at your skin regularly.  Use a mirror to look at your back. Notify your caregivers of changes in moles, especially if there are changes in shapes, colors, a size larger than a pencil eraser, an irregular border, or development of new moles. ? ?Safety: ?Use seatbelts 100% of the time, whether driving or as a passenger.  Use safety devices such as hearing protection if you work in environments with loud noise or significant background noise.  Use safety glasses when doing any work that could send debris in to the eyes.  Use a helmet if you ride a bike or motorcycle.  Use appropriate safety gear for contact sports.  Talk to your caregiver about gun safety. ?Use sunscreen with a SPF (or skin protection factor) of 15 or greater.  Lighter skinned people are at a greater risk of skin cancer. Don?t forget to also wear sunglasses in order to protect your eyes from too much damaging sunlight. Damaging sunlight can  accelerate cataract formation.  ?Practice safe sex. Use condoms. Condoms are used for birth control and to help reduce the spread of sexually transmitted infections (or STIs).  Some of the STIs are gonorrhea (the clap), chlamydia, syphilis, trichomonas, herpes, HPV (human papilloma virus) and HIV (human immunodeficiency virus) which causes AIDS. The herpes, HIV and HPV are viral illnesses that have no cure. These can result in disability, cancer and death.  ?Keep carbon monoxide and smoke detectors in your home functioning at all times. Change the batteries every 6 months or use a model that plugs into the wall.  ? ?Vaccinations: ?Stay up to date with your tetanus shots and other required immunizations. You should have a booster for tetanus every 10 years. Be sure to get your flu shot every year, since 5%-20% of the [place] population comes down with the flu. The flu vaccine changes each year, so being vaccinated once is not enough. Get your shot in the fall, before the flu season peaks.   ?Other vaccines to consider: ?Pneumococcal vaccine to protect against certain types of pneumonia.  This is normally recommended for adults age   65 or older.  However, adults younger than 47 years old with certain underlying conditions such as diabetes, heart or lung disease should also receive the vaccine. ?Shingles vaccine to protect against Varicella Zoster if you are older than age 60, or younger than 47 years old with certain underlying illness. ?Hepatitis A vaccine to protect against a form of infection of the liver by a virus acquired from food. ?Hepatitis B vaccine to protect against a form of infection of the liver by a virus acquired from blood or body fluids, particularly if you work in health care. ?If you plan to travel internationally, check with your local health department for specific vaccination recommendations. ? ?Cancer Screening: ?Most routine colon cancer screening begins at the age of 50. On a yearly basis,  doctors may provide special easy to use take-home tests to check for hidden blood in the stool. Sigmoidoscopy or colonoscopy can detect the earliest forms of colon cancer and is life saving. These tests use a smal

## 2021-06-19 NOTE — Assessment & Plan Note (Addendum)
Stable, drink 8 - 10 glasses of water daily, limit sugar intake and eat a low fat diet, exercise 3 - 5 days a week, example walking 1 - 2 miles  ? ?

## 2021-06-19 NOTE — Progress Notes (Signed)
? ?Complete physical exam ? ? ?Patient: Kyle Patton   DOB: 10-16-1975   46 y.o. Male  MRN: [id number] ?Visit Date: 06/19/2021 ? ?Chief Complaint  ?Patient presents with  ? Annual Exam  ?  Fasting CPE- No other concerns  ? ?Subjective  ?  ?Kyle Patton is a 46 y.o. male who presents today for a complete physical exam.  ? ?Reports is generally feeling well; is eating a healthy diet; is sleeping well 6 - 7 hours; drinks 4 bottles of water a day; is exercising at home doing push-ups and sit-ups at home, also stays active with a lot walking at work; lost 52 pounds on [place] and is tolerating the medicine well; reports that he's had occasional right sided neck pain for a while that he thinks gets worse when he has stress, Tylenol and ibuprofen helps; denies injury or fall; declines an x-ray or referral to Orthopedics at this time and states it is manageable; denies having neck pain today ? ? ?HPI ?HPI   ? ? Annual Exam   ? Additional comments: Fasting CPE- No other concerns ? ?  ?  ?Last edited by Deforest Hoyles, Arbuckle on 06/19/2021  8:38 AM.  ?  ?  ? ? ?Past Medical History:  ?Diagnosis Date  ? Acid reflux   ? Asthma   ? ED (erectile dysfunction) 06/02/2018  ? Elevated LDL cholesterol level 02/19/2018  ? Obesity (BMI 30-39.9) 01/31/2018  ? Sleep apnea   ? PT uses C=PAP  ? TB (tuberculosis), treated   ? ?History reviewed. No pertinent surgical history. ?Social History  ? ?Socioeconomic History  ? Marital status: Married  ?  Spouse name: Not on file  ? Number of children: Not on file  ? Years of education: Not on file  ? Highest education level: Not on file  ?Occupational History  ? Not on file  ?Tobacco Use  ? Smoking status: Former  ?  Packs/day: 0.50  ?  Years: 10.00  ?  Pack years: 5.00  ?  Types: Cigarettes  ?  Quit date: 08/13/2007  ?  Years since quitting: 13.8  ? Smokeless tobacco: Never  ?Vaping Use  ? Vaping Use: Never used  ?Substance and Sexual Activity  ? Alcohol use: Yes  ?  Alcohol/week: 2.0 standard  drinks  ?  Types: 2 Standard drinks or equivalent per week  ?  Comment: weekly   ? Drug use: No  ? Sexual activity: Not on file  ?Other Topics Concern  ? Not on file  ?Social History Narrative  ? Not on file  ? ?Social Determinants of Health  ? ?Financial Resource Strain: Not on file  ?Food Insecurity: Not on file  ?Transportation Needs: Not on file  ?Physical Activity: Not on file  ?Stress: Not on file  ?Social Connections: Not on file  ?Intimate Partner Violence: Not on file  ? ?Family Status  ?Relation Name Status  ? Mother  Alive  ? Father  Alive  ? Brother  Alive  ? MGM  Alive  ? Neg Hx  (Not Specified)  ? ?Family History  ?Problem Relation Age of Onset  ? Breast cancer Mother   ? Hypertension Mother   ? Hyperlipidemia Mother   ? Diabetes Brother   ? Diabetes Maternal Grandmother   ? Colon cancer Neg Hx   ? Prostate cancer Neg Hx   ? Heart attack Neg Hx   ? CVA Neg Hx   ? ?No Known Allergies  ?Patient  Care Team: ?Marcellina Millin as PCP - General (Physician Assistant)  ? ?Medications: ?Outpatient Medications Prior to Visit  ?Medication Sig  ? tirzepatide (MOUNJARO) 5 MG/0.5ML Pen Inject 5 mg into the skin once a week.  ? [DISCONTINUED] omeprazole (PRILOSEC) 20 MG capsule Take 20 mg by mouth daily. (Patient not taking: Reported on 06/19/2021)  ? ?No facility-administered medications prior to visit.  ? ? ?Review of Systems  ?Constitutional:  Negative for activity change and fever.  ?HENT:  Negative for congestion, ear pain and voice change.   ?Eyes:  Negative for redness.  ?Respiratory:  Negative for cough.   ?Cardiovascular:  Negative for chest pain.  ?Gastrointestinal:  Negative for constipation and diarrhea.  ?Endocrine: Negative for polyuria.  ?Genitourinary:  Negative for flank pain.  ?Musculoskeletal:  Negative for gait problem and neck stiffness.  ?Skin:  Negative for color change and rash.  ?Neurological:  Negative for dizziness.  ?Hematological:  Negative for adenopathy.  ?Psychiatric/Behavioral:   Negative for agitation, behavioral problems and confusion.   ? ?Last CBC ?Lab Results  ?Component Value Date  ? WBC 4.8 06/16/2020  ? HGB 15.4 06/16/2020  ? HCT 46.7 06/16/2020  ? MCV 93 06/16/2020  ? MCH 30.7 06/16/2020  ? RDW 12.2 06/16/2020  ? PLT 224 06/16/2020  ? ?Last metabolic panel ?Lab Results  ?Component Value Date  ? GLUCOSE 103 (H) 06/16/2020  ? NA 141 06/16/2020  ? K 4.7 06/16/2020  ? CL 101 06/16/2020  ? CO2 20 06/16/2020  ? BUN 12 06/16/2020  ? CREATININE 1.17 06/16/2020  ? EGFR 78 06/16/2020  ? CALCIUM 9.6 06/16/2020  ? PROT 7.4 06/16/2020  ? ALBUMIN 4.7 06/16/2020  ? LABGLOB 2.7 06/16/2020  ? AGRATIO 1.7 06/16/2020  ? BILITOT 0.4 06/16/2020  ? ALKPHOS 113 06/16/2020  ? AST 18 06/16/2020  ? ALT 36 06/16/2020  ? ?Last lipids ?Lab Results  ?Component Value Date  ? CHOL 214 (H) 06/16/2020  ? HDL 41 06/16/2020  ? LDLCALC 162 (H) 06/16/2020  ? TRIG 60 06/16/2020  ? CHOLHDL 5.2 (H) 06/16/2020  ? ?Last hemoglobin A1c ?Lab Results  ?Component Value Date  ? HGBA1C 6.0 (H) 06/16/2020  ? ?  ? ?The 10-year ASCVD risk score (Arnett DK, et al., 2019) is: 4.8% ? ? Objective  ?  ?BP 130/90   Pulse 72   Ht '5\' 6"'  (1.676 m)   Wt 187 lb (84.8 kg)   SpO2 96%   BMI 30.18 kg/m?  ? ?  ? ? ?Physical Exam ?Vitals and nursing note reviewed.  ?Constitutional:   ?   General: He is not in acute distress. ?   Appearance: Normal appearance.  ?HENT:  ?   Head: Normocephalic and atraumatic.  ?   Right Ear: Tympanic membrane, ear canal and external ear normal.  ?   Left Ear: Tympanic membrane, ear canal and external ear normal.  ?   Nose: No congestion.  ?Eyes:  ?   Extraocular Movements: Extraocular movements intact.  ?   Conjunctiva/sclera: Conjunctivae normal.  ?   Pupils: Pupils are equal, round, and reactive to light.  ?Neck:  ?   Vascular: No carotid bruit.  ?Cardiovascular:  ?   Rate and Rhythm: Normal rate and regular rhythm.  ?   Pulses: Normal pulses.  ?   Heart sounds: Normal heart sounds.  ?Pulmonary:  ?   Effort:  Pulmonary effort is normal.  ?   Breath sounds: Normal breath sounds. No wheezing.  ?Abdominal:  ?  General: Bowel sounds are normal.  ?   Palpations: Abdomen is soft.  ?Musculoskeletal:     ?   General: Normal range of motion.  ?   Cervical back: Normal range of motion and neck supple.  ?   Right lower leg: No edema.  ?   Left lower leg: No edema.  ?Skin: ?   General: Skin is warm and dry.  ?   Findings: No rash.  ?Neurological:  ?   Mental Status: He is alert and oriented to person, place, and time.  ?   Gait: Gait normal.  ?Psychiatric:     ?   Mood and Affect: Mood normal.     ?   Behavior: Behavior normal.  ?  ? ? ?Last depression screening scores ? ?  06/19/2021  ?  8:39 AM 06/16/2020  ?  8:53 AM 01/31/2018  ?  9:56 AM  ?PHQ 2/9 Scores  ?PHQ - 2 Score 0 0 0  ? ?Last fall risk screening ? ?  06/19/2021  ?  8:39 AM  ?Fall Risk   ?Falls in the past year? 0  ?Number falls in past yr: 0  ?Injury with Fall? 0  ?Risk for fall due to : No Fall Risks  ?Follow up Falls evaluation completed  ? ? ? ?No results found for any visits on 06/19/21. ? Assessment & Plan  ?  ?Routine Health Maintenance and Physical Exam ? ?Exercise Activities and Dietary recommendations ? Goals   ?None ?  ? ? ?Immunization History  ?Administered Date(s) Administered  ? PFIZER(Purple Top)SARS-COV-2 Vaccination 04/24/2019, 05/18/2019  ? Tdap 01/31/2018  ? ? ?Health Maintenance  ?Topic Date Due  ? COVID-19 Vaccine (3 - Booster for Pfizer series) 07/05/2021 (Originally 07/13/2019)  ? TETANUS/TDAP  02/01/2028  ? Hepatitis C Screening  Completed  ? HIV Screening  Completed  ? HPV VACCINES  Aged Out  ? INFLUENZA VACCINE  Discontinued  ? Fecal DNA (Cologuard)  Discontinued  ? ? ?Discussed health benefits of physical activity, and encouraged him to engage in regular exercise appropriate for his age and condition. ? ?Problem List Items Addressed This Visit   ? ?  ? Other  ? Prediabetes  ?  controlled, eat a low sugar diet, avoid starchy food with a lot of  carbohydrates, avoid fried and processed foods; last hgb a1c 6.0 on 06/16/2020 ? ?  ?  ? Relevant Orders  ? Comprehensive metabolic panel  ? Hemoglobin A1c  ? Mixed hyperlipidemia  ?  controlled, eat a low fat diet, increa

## 2021-06-20 ENCOUNTER — Encounter: Payer: Self-pay | Admitting: Physician Assistant

## 2021-08-11 ENCOUNTER — Encounter: Payer: Self-pay | Admitting: Internal Medicine

## 2021-09-15 ENCOUNTER — Encounter: Payer: Self-pay | Admitting: Physician Assistant

## 2021-10-25 ENCOUNTER — Telehealth: Payer: Self-pay | Admitting: Physician Assistant

## 2021-10-25 ENCOUNTER — Other Ambulatory Visit: Payer: Self-pay

## 2021-10-25 DIAGNOSIS — R7303 Prediabetes: Secondary | ICD-10-CM

## 2021-10-25 MED ORDER — TIRZEPATIDE 5 MG/0.5ML ~~LOC~~ SOAJ: 5.0000 mg | SUBCUTANEOUS | 3 refills | Status: DC

## 2021-10-25 NOTE — Telephone Encounter (Signed)
Resending to adam to take care of

## 2021-10-25 NOTE — Telephone Encounter (Signed)
Pt called and is requesting a refill on his moujaro 5 mg  Pt also said when it is sent in he needs a copay card to be sent in with it Pt uses  [id number] [place], [place] - [address]

## 2021-10-28 ENCOUNTER — Telehealth: Payer: Self-pay | Admitting: Medical

## 2021-10-28 NOTE — Telephone Encounter (Signed)
P.A. MOUNJARO 

## 2021-11-01 NOTE — Telephone Encounter (Unsigned)
P.A. denied, Christella Scheuermann will only pay for [facility] with diagnosis of Type 2 diabetes

## 2021-11-13 NOTE — Telephone Encounter (Signed)
Pt came back by & states that Kyle Patton says [place] not covered.  Ma Rings said [place] covered, then [employer] can be requested after they are tried as step therapy. I advised they may not cover b/c he doesn't have diabetes but we can see.   Audelia Acton can pt be switched to Trulicity or Bydureon?

## 2021-11-13 NOTE — Telephone Encounter (Signed)
Informed pt to call Christella Scheuermann to see if [facility] or Saxenda or any weight loss meds are covered.

## 2021-11-14 ENCOUNTER — Other Ambulatory Visit: Payer: Self-pay | Admitting: Medical

## 2021-11-14 MED ORDER — TRULICITY 0.75 MG/0.5ML ~~LOC~~ SOAJ: 0.7500 mg | SUBCUTANEOUS | 0 refills | Status: DC

## 2021-11-20 NOTE — Telephone Encounter (Signed)
Pt informed, [facility] & call back if any issues with coverage

## 2021-11-21 ENCOUNTER — Encounter: Payer: Self-pay | Admitting: Internal Medicine

## 2021-11-25 ENCOUNTER — Telehealth: Payer: Self-pay | Admitting: Physician Assistant

## 2021-11-25 NOTE — Telephone Encounter (Signed)
P.A. TRULICITY 

## 2021-12-01 NOTE — Telephone Encounter (Signed)
P.A. approved til 11/24/22, pt informed

## 2021-12-26 ENCOUNTER — Encounter: Payer: Self-pay | Admitting: Internal Medicine

## 2021-12-29 ENCOUNTER — Other Ambulatory Visit: Payer: Self-pay | Admitting: Medical

## 2021-12-29 ENCOUNTER — Telehealth: Payer: Self-pay

## 2021-12-29 MED ORDER — BYDUREON BCISE 2 MG/0.85ML ~~LOC~~ AUIJ: 2.0000 mg | AUTO-INJECTOR | SUBCUTANEOUS | 0 refills | Status: DC

## 2021-12-29 NOTE — Telephone Encounter (Signed)
Pt. Called stating he was switched to Trulicity and he said it is not working. He has not noticed any weight loss. He wanted to know if he should increase the dosage or switch to a different medication. I told him he would probably have to schedule an apt. To discuss the options. He wanted to know what he should do about tomorrow he is supposed to take another shot tomorrow but he does not have any more Trulicity and would need a refill on it or should he just wait to start a new med or increased dose.

## 2022-01-08 ENCOUNTER — Telehealth: Payer: Self-pay | Admitting: Nurse Practitioner

## 2022-01-08 NOTE — Telephone Encounter (Signed)
Needs new PA for trulicity  ( med/dose change per pt)

## 2022-01-16 ENCOUNTER — Telehealth: Payer: Self-pay | Admitting: Nurse Practitioner

## 2022-01-16 NOTE — Telephone Encounter (Signed)
Pt states he never changed to Bydureon, said wasn't covered by insurance (probably required P.A.)  He said he hasn't lost any weight on the Trulicity, would like to increase the dose first and see if he gets any benefit from that before changing to Bydureon.  He is still only on Trulicity starter dose of .75mg  & having no side effects.  Please send in RX & then send back to me

## 2022-01-17 ENCOUNTER — Other Ambulatory Visit: Payer: Self-pay | Admitting: Medical

## 2022-01-17 MED ORDER — TRULICITY 1.5 MG/0.5ML ~~LOC~~ SOAJ: 1.5000 mg | SUBCUTANEOUS | 1 refills | Status: DC

## 2022-01-17 NOTE — Telephone Encounter (Signed)
Pt called & Trulicity 1.5 is on order will be in a couple days.  Asked him to check with pharmacy & see if going thru insurance or if needs P.A.

## 2022-02-19 NOTE — Telephone Encounter (Signed)
Called pt and scheduled follow up appt with [facility]

## 2022-02-22 ENCOUNTER — Encounter: Payer: Self-pay | Admitting: Nurse Practitioner

## 2022-02-22 ENCOUNTER — Ambulatory Visit (INDEPENDENT_AMBULATORY_CARE_PROVIDER_SITE_OTHER): Payer: Managed Care, Other (non HMO) | Admitting: Nurse Practitioner

## 2022-02-22 VITALS — BP 130/80 | HR 76 | Ht 66.25 in | Wt 200.2 lb

## 2022-02-22 DIAGNOSIS — E782 Mixed hyperlipidemia: Secondary | ICD-10-CM | POA: Diagnosis not present

## 2022-02-22 DIAGNOSIS — E6609 Other obesity due to excess calories: Secondary | ICD-10-CM

## 2022-02-22 DIAGNOSIS — E669 Obesity, unspecified: Secondary | ICD-10-CM

## 2022-02-22 DIAGNOSIS — R7303 Prediabetes: Secondary | ICD-10-CM

## 2022-02-22 DIAGNOSIS — G4733 Obstructive sleep apnea (adult) (pediatric): Secondary | ICD-10-CM

## 2022-02-22 DIAGNOSIS — E291 Testicular hypofunction: Secondary | ICD-10-CM | POA: Diagnosis not present

## 2022-02-22 DIAGNOSIS — R030 Elevated blood-pressure reading, without diagnosis of hypertension: Secondary | ICD-10-CM

## 2022-02-22 DIAGNOSIS — Z6832 Body mass index (BMI) 32.0-32.9, adult: Secondary | ICD-10-CM

## 2022-02-22 NOTE — Patient Instructions (Signed)
We will see what your labs look like and then see if we can get coverage for the [facility] or Zepbound.   WEIGHT LOSS PLANNING Your progress today shows:     02/22/2022    9:55 AM 06/19/2021    8:40 AM 12/16/2020    7:42 PM  Vitals with BMI  Height 5' 6.25" 5\' 6"  5\' 6"   Weight 200 lbs 3 oz 187 lbs 190 lbs  BMI 32.06 50.2 77.41  Systolic 287 867   Diastolic 80 90   Pulse 76 72     For best management of weight, it is vital to balance intake versus output. This means the number of calories burned per day must be less than the calories you take in with food and drink.   I recommend trying to follow a diet with the following: Calories: 1200-1500 calories per day Carbohydrates: 150-180 grams of carbohydrates per day  Why: Gives your body enough "quick fuel" for cells to maintain normal function without sending them into starvation mode.  Protein: At least 45-55 grams of protein per day Why: Protein takes longer and uses more energy than carbohydrates to break down for fuel. The carbohydrates in your meals serves as quick energy sources and proteins help use some of that extra quick energy to break down to produce long term energy. This helps you not feel hungry as quickly and protein breakdown burns calories.  Water: Drink AT LEAST 64 ounces of water per day  Why: Water is essential to healthy metabolism. Water helps to fill the stomach and keep you fuller longer. Water is required for healthy digestion and filtering of waste in the body.  Fat: Limit fats in your diet- when choosing fats, choose foods with lower fats content such as lean meats (chicken, fish, [place]).  Why: Increased fat intake leads to storage "for later". Once you burn your carbohydrate energy, your body goes into fat and protein breakdown mode to help you loose weight.  Cholesterol: Fats and oils that are LIQUID at room temperature are best. Choose vegetable oils (olive oil, avocado oil, nuts). Avoid fats that are SOLID at  room temperature (animal fats, processed meats). Healthy fats are often found in whole grains, beans, nuts, seeds, and berries.  Why: Elevated cholesterol levels lead to build up of cholesterol on the inside of your blood vessels. This will eventually cause the blood vessels to become hard and can lead to high blood pressure and damage to your organs. When the blood flow is reduced, but the pressure is high from cholesterol buildup, parts of the cholesterol can break off and form clots that can go to the brain or heart leading to a stroke or heart attack.  Fiber: Increase amount of SOLUBLE the fiber in your diet. This helps to fill you up, lowers cholesterol, and helps with digestion. Some foods high in soluble fiber are oats, peas, beans, apples, carrots, barley, and citrus fruits.   Why: Fiber fills you up, helps remove excess cholesterol, and aids in healthy digestion which are all very important in weight management.   I recommend the following as a minimum activity routine: Purposeful walk or other physical activity at least 20 minutes every single day. This means purposefully taking a walk, jog, bike, swim, treadmill, elliptical, dance, etc.  This activity should be ABOVE your normal daily activities, such as walking at work. Goal exercise should be at least 150 minutes a week- work your way up to this.   Heart Rate: Your  maximum exercise heart rate should be 220 - Your Age in Years. When exercising, get your heart rate up, but avoid going over the maximum targeted heart rate.  60-70% of your maximum heart rate is where you tend to burn the most fat. To find this number:  220 - Age In Years= Max HR  Max HR x 0.6 (or 0.7) = Fat Burning HR The Fat Burning HR is your goal heart rate while working out to burn the most fat.  NEVER exercise to the point your feel lightheaded, weak, nauseated, dizzy. If you experience ANY of these symptoms- STOP exercise! Allow yourself to cool down and your heart  rate to come down. Then restart slower next time.  If at [place] you feel chest pain or chest pressure during exercise, STOP IMMEDIATELY and seek medical attention.

## 2022-02-22 NOTE — Progress Notes (Signed)
Worthy Keeler, DNP, AGNP-c [facility]  [address] [place], [place] [address] [phone]  ESTABLISHED PATIENT- Chronic Health and/or Follow-Up Visit  Blood pressure 130/80, pulse 76, height 5' 6.25" (1.683 m), weight 200 lb 3.2 oz (90.8 kg).    Kyle Patton is a 47 y.o. year old male presenting today for evaluation and management of the following: Weight Gain - BMI 32 at this time. He has been on Trulicity for about 8 weeks and he went up to the 1.5mg  dose and in this time he gained weight. He has a diagnosis of pre-diabetes, hyperlipidemia, sleep apnea, BMI 32, hypogonadism, elevated blood pressure readings, and LE edema. He has tried [place] and both were very effective. We have tried metformin, as well, however he was unable to tolerate these due to side effects. I will recheck his A1c and labs today to see where is A1c and lipids are to determine what additional treatment options would be best. He has been working on his diet and exercising daily with weight gain since his last visit. He is very frustrated with the lack of success.    All ROS negative with exception of what is listed above.   PHYSICAL EXAM Physical Exam Vitals and nursing note reviewed.  Constitutional:      Appearance: Normal appearance. He is obese.  HENT:     Head: Normocephalic.  Eyes:     Extraocular Movements: Extraocular movements intact.     Pupils: Pupils are equal, round, and reactive to light.  Neck:     Vascular: No carotid bruit.  Cardiovascular:     Rate and Rhythm: Normal rate and regular rhythm.     Pulses: Normal pulses.     Heart sounds: Normal heart sounds.  Pulmonary:     Effort: Pulmonary effort is normal.     Breath sounds: Normal breath sounds.  Abdominal:     General: Bowel sounds are normal. There is no distension.     Palpations: Abdomen is soft.     Tenderness: There is no abdominal tenderness. There is no guarding.  Musculoskeletal:         General: Normal range of motion.     Cervical back: Normal range of motion.     Right lower leg: No edema.     Left lower leg: No edema.  Lymphadenopathy:     Cervical: No cervical adenopathy.  Skin:    General: Skin is warm and dry.     Capillary Refill: Capillary refill takes less than 2 seconds.  Neurological:     General: No focal deficit present.     Mental Status: He is alert and oriented to person, place, and time.  Psychiatric:        Mood and Affect: Mood normal.        Behavior: Behavior normal.        Thought Content: Thought content normal.        Judgment: Judgment normal.     PLAN Problem List Items Addressed This Visit     Sleep apnea    Chronic. Using a CPAP for management. I do feel that weight loss would help reduce the risk of worsening OSA and reduce additional cardiovascular risks associated with his chronic conditions. We will see if we can get approval for zepbound for weight management given he has had such success with mounjaro in the past. We will plan to follow-up in 3 months.       Relevant Medications  tirzepatide (ZEPBOUND) 2.5 MG/0.5ML Pen   Prediabetes - Primary    Elevated A1c with co-morbidities of HLD, elevated BP, and obesity. Recommendations for diet and exercise discussed with patient today as well as medication changes that may be helpful for management of chronic conditions. We will trial zepbound at this time and see if we can get improvement on these conditions to help lower his cardiovascular risks. F/U in 3 months or sooner if needed.       Relevant Medications   tirzepatide (ZEPBOUND) 2.5 MG/0.5ML Pen   Mixed hyperlipidemia    Chronic. Labs pending today. Recommend continue to work on diet and exercise with weight management and low saturated fat.       Relevant Medications   tirzepatide (ZEPBOUND) 2.5 MG/0.5ML Pen   Class 1 obesity with serious comorbidity and body mass index (BMI) of 32.0 to 32.9 in adult    Chronic  concerns with weight in the setting of OSA, elevated blood pressure readings, pre-diabetes, and hyperlipidemia. Increased weight control would significantly reduce his cardiovascular risks as well as potentially resolve his chronic medical conditions. We will stop trulicity at this time and work to get approval for zepbound for weight management and cardiovascular health. Continue with diet and exercise.       Relevant Medications   tirzepatide (ZEPBOUND) 2.5 MG/0.5ML Pen   Other Visit Diagnoses     Hypogonadism in male       Relevant Medications   tirzepatide (ZEPBOUND) 2.5 MG/0.5ML Pen   Elevated blood pressure reading       Relevant Medications   tirzepatide (ZEPBOUND) 2.5 MG/0.5ML Pen       Return for Will plan once we get medications started.   Worthy Keeler, DNP, AGNP-c 02/22/2022  9:56 AM

## 2022-02-23 ENCOUNTER — Telehealth: Payer: Self-pay | Admitting: Nurse Practitioner

## 2022-02-23 NOTE — Telephone Encounter (Signed)
Pt called in and needs a PA on his mounjaro. He asks if you can call when you get back into the office [phone].

## 2022-02-25 MED ORDER — ZEPBOUND 2.5 MG/0.5ML ~~LOC~~ SOAJ: 2.5000 mg | SUBCUTANEOUS | 0 refills | Status: DC

## 2022-02-25 NOTE — Assessment & Plan Note (Signed)
Chronic concerns with weight in the setting of OSA, elevated blood pressure readings, pre-diabetes, and hyperlipidemia. Increased weight control would significantly reduce his cardiovascular risks as well as potentially resolve his chronic medical conditions. We will stop trulicity at this time and work to get approval for zepbound for weight management and cardiovascular health. Continue with diet and exercise.

## 2022-02-25 NOTE — Assessment & Plan Note (Signed)
Chronic. Using a CPAP for management. I do feel that weight loss would help reduce the risk of worsening OSA and reduce additional cardiovascular risks associated with his chronic conditions. We will see if we can get approval for zepbound for weight management given he has had such success with mounjaro in the past. We will plan to follow-up in 3 months.

## 2022-02-25 NOTE — Assessment & Plan Note (Signed)
Elevated A1c with co-morbidities of HLD, elevated BP, and obesity. Recommendations for diet and exercise discussed with patient today as well as medication changes that may be helpful for management of chronic conditions. We will trial zepbound at this time and see if we can get improvement on these conditions to help lower his cardiovascular risks. F/U in 3 months or sooner if needed.

## 2022-02-25 NOTE — Assessment & Plan Note (Signed)
Chronic. Labs pending today. Recommend continue to work on diet and exercise with weight management and low saturated fat.

## 2022-02-26 ENCOUNTER — Other Ambulatory Visit: Payer: Self-pay | Admitting: Nurse Practitioner

## 2022-02-26 DIAGNOSIS — E291 Testicular hypofunction: Secondary | ICD-10-CM

## 2022-02-26 DIAGNOSIS — R030 Elevated blood-pressure reading, without diagnosis of hypertension: Secondary | ICD-10-CM

## 2022-02-26 DIAGNOSIS — E782 Mixed hyperlipidemia: Secondary | ICD-10-CM

## 2022-02-26 DIAGNOSIS — G4733 Obstructive sleep apnea (adult) (pediatric): Secondary | ICD-10-CM

## 2022-02-26 DIAGNOSIS — R7303 Prediabetes: Secondary | ICD-10-CM

## 2022-02-26 DIAGNOSIS — E6609 Other obesity due to excess calories: Secondary | ICD-10-CM

## 2022-03-06 ENCOUNTER — Telehealth: Payer: Self-pay | Admitting: Nurse Practitioner

## 2022-03-09 ENCOUNTER — Telehealth: Payer: Self-pay | Admitting: Nurse Practitioner

## 2022-03-09 NOTE — Telephone Encounter (Signed)
P.A. ZEPBOUND  

## 2022-04-02 NOTE — Telephone Encounter (Signed)
Zepbound denied,  I Called [employer] t# [phone], all weight loss meds are plan exclusion, no need for appeal.  Called pt and informed, he was very disappointed, I asked him and he hasn't been on any GLP's in over 2 months.  Labs were not drawn at last OV.  Can we bring him back in for HGB A1c & see what his numbers look like and go from there?  And if this is ok do you want him fasting for any other labs or just HGB A1c?

## 2022-04-05 NOTE — Telephone Encounter (Signed)
Zepbound/not mounjaro was denied, see telephone call

## 2022-04-05 NOTE — Telephone Encounter (Signed)
Sent mychart message

## 2022-04-17 ENCOUNTER — Other Ambulatory Visit: Payer: Managed Care, Other (non HMO)

## 2022-04-17 DIAGNOSIS — R7303 Prediabetes: Secondary | ICD-10-CM

## 2022-04-18 LAB — HEMOGLOBIN A1C
Est. average glucose Bld gHb Est-mCnc: 120 mg/dL
Hgb A1c MFr Bld: 5.8 % — ABNORMAL HIGH (ref 4.8–5.6)

## 2022-04-19 ENCOUNTER — Telehealth: Payer: Self-pay | Admitting: Family Medicine

## 2022-04-19 NOTE — Telephone Encounter (Signed)
Per Christella Scheuermann, Zepbound is excluded from coverage.  [employer] simply does not cover this medication, no matter the reason.

## 2022-04-23 NOTE — Telephone Encounter (Signed)
Pt informed of labs & denial & since he is not diabetic his insurance will not pay for any weight loss or GLP1s.  He states his insurance is going to change as of 05/14/22 & he will get me his new card so we can try this again.

## 2022-06-13 ENCOUNTER — Telehealth: Payer: Self-pay | Admitting: Family Medicine

## 2022-06-13 NOTE — Telephone Encounter (Signed)
Pt called & states has new ins. [employer] & he called them & they will cover [facility] & Trulicity, he says for weight loss.  Went ahead & completed a P.A. for Greggory Keen since pt has active Rx

## 2022-06-15 ENCOUNTER — Telehealth: Payer: Self-pay

## 2022-06-15 DIAGNOSIS — R7303 Prediabetes: Secondary | ICD-10-CM

## 2022-06-15 DIAGNOSIS — E782 Mixed hyperlipidemia: Secondary | ICD-10-CM

## 2022-06-15 DIAGNOSIS — G4734 Idiopathic sleep related nonobstructive alveolar hypoventilation: Secondary | ICD-10-CM

## 2022-06-15 DIAGNOSIS — E6609 Other obesity due to excess calories: Secondary | ICD-10-CM

## 2022-06-15 DIAGNOSIS — G4733 Obstructive sleep apnea (adult) (pediatric): Secondary | ICD-10-CM

## 2022-06-15 MED ORDER — WEGOVY 0.25 MG/0.5ML ~~LOC~~ SOAJ: 0.2500 mg | SUBCUTANEOUS | 0 refills | Status: DC

## 2022-06-15 NOTE — Telephone Encounter (Signed)
I have sent in for [facility], however, do not know if a PA is required for this or not. If a PA is required, this will not be available today, unfortunately.

## 2022-06-15 NOTE — Telephone Encounter (Signed)
Pt. Walked into the office at 4:33 today wanting to know if there was anything we could do for him today about getting one of his medication approved or sent in today. Looks like Zepbound was denied by his ins. He spoke with his ins. Company today and they told him he would have to try Wegovy first before Zepbound would be able to be covered. He wanted to know if we could send in [facility] for him today so hopefully he could get one something for his diabetes and weight loss. It looks like Vernona Rieger has a message in the system that she has been working on a PA for [employer] for him but it is still pending.

## 2022-06-20 ENCOUNTER — Telehealth: Payer: Self-pay | Admitting: Nurse Practitioner

## 2022-06-20 NOTE — Telephone Encounter (Signed)
P.A. denied, only covered for Type 2 Diabetes, pt informed

## 2022-06-20 NOTE — Telephone Encounter (Signed)
P.A. WEGOVY completed 06/18/22 on Cover my meds  06/20/22 Status = Wait for Determination Please wait for the payer to return a determination

## 2022-07-13 ENCOUNTER — Encounter: Payer: Self-pay | Admitting: Family Medicine

## 2022-07-13 ENCOUNTER — Ambulatory Visit: Payer: BC Managed Care – PPO | Admitting: Family Medicine

## 2022-07-13 VITALS — BP 130/86 | HR 75 | Temp 97.8°F | Ht 66.25 in | Wt 214.0 lb

## 2022-07-13 DIAGNOSIS — E782 Mixed hyperlipidemia: Secondary | ICD-10-CM | POA: Diagnosis not present

## 2022-07-13 DIAGNOSIS — G4733 Obstructive sleep apnea (adult) (pediatric): Secondary | ICD-10-CM | POA: Diagnosis not present

## 2022-07-13 DIAGNOSIS — Z833 Family history of diabetes mellitus: Secondary | ICD-10-CM

## 2022-07-13 DIAGNOSIS — R7303 Prediabetes: Secondary | ICD-10-CM

## 2022-07-13 DIAGNOSIS — Z6834 Body mass index (BMI) 34.0-34.9, adult: Secondary | ICD-10-CM

## 2022-07-13 DIAGNOSIS — E669 Obesity, unspecified: Secondary | ICD-10-CM

## 2022-07-13 MED ORDER — ZEPBOUND 2.5 MG/0.5ML ~~LOC~~ SOAJ: 2.5000 mg | SUBCUTANEOUS | 1 refills | Status: DC

## 2022-07-13 NOTE — Assessment & Plan Note (Signed)
Recommend using CPAP regularly. I also recommend starting back on GLP-1, Zepbound for weight loss.

## 2022-07-13 NOTE — Assessment & Plan Note (Signed)
Recommend low sugar, low carbohydrate diet and increasing physical activity.  Hoping to restart tirzepatide.  He has had success with tirzepatide in the past.

## 2022-07-13 NOTE — Progress Notes (Signed)
New Patient Office Visit  Subjective    Patient ID: Kyle Patton, male    DOB: [birth year]  Age: 47 y.o. MRN: [id number]  CC:  Chief Complaint  Patient presents with   Transitions Of Care    Would like to discuss weight loss, getting sleep apnea again. Zepbound    HPI Kyle Patton presents to establish care.  He was my patient at my previous practice.  I last saw him 06/16/2020.  Main concern today is obesity.  Would like to start back on weight loss medication.  He has previously been on Mounjaro and lost a significant amount of weight.  He started to gain the weight back.  He was then started on Trulicity which did not work for him.  He is interested in starting Zepbound.  Prediabetes with his last A1c 5.8% on 04/17/2022.  As far as I can tell he does not have a history of diabetes.  He does have a family history of diabetes.  Hyperlipidemia-LDL 117 in May 2023.  Normal triglycerides and HDL.  He is not on a statin.  OSA.  States his sleep apnea improved significantly when he lost weight on Mounjaro.  Now that he is gaining weight he is having more issues with sleep apnea and starting to use his CPAP again.     Outpatient Encounter Medications as of 07/13/2022  Medication Sig   omeprazole (PRILOSEC) 20 MG capsule Take 20 mg by mouth daily.   tirzepatide (ZEPBOUND) 2.5 MG/0.5ML Pen Inject 2.5 mg into the skin once a week.   [DISCONTINUED] Semaglutide-Weight Management (WEGOVY) 0.25 MG/0.5ML SOAJ Inject 0.25 mg into the skin once a week. (Patient not taking: Reported on 07/13/2022)   No facility-administered encounter medications on file as of 07/13/2022.    Past Medical History:  Diagnosis Date   Acid reflux    Asthma    ED (erectile dysfunction) 06/02/2018   Elevated LDL cholesterol level 02/19/2018   Obesity (BMI 30-39.9) 01/31/2018   Sleep apnea    PT uses C=PAP   TB (tuberculosis), treated     History reviewed. No pertinent surgical history.  Family History   Problem Relation Age of Onset   Breast cancer Mother    Hypertension Mother    Hyperlipidemia Mother    Diabetes Brother    Diabetes Maternal Grandmother    Colon cancer Neg Hx    Prostate cancer Neg Hx    Heart attack Neg Hx    CVA Neg Hx     Social History   Socioeconomic History   Marital status: Married    Spouse name: Not on file   Number of children: Not on file   Years of education: Not on file   Highest education level: Not on file  Occupational History   Not on file  Tobacco Use   Smoking status: Former    Packs/day: 0.50    Years: 10.00    Additional pack years: 0.00    Total pack years: 5.00    Types: Cigarettes    Quit date: 08/13/2007    Years since quitting: 14.9   Smokeless tobacco: Never  Vaping Use   Vaping Use: Never used  Substance and Sexual Activity   Alcohol use: Yes    Alcohol/week: 2.0 standard drinks of alcohol    Types: 2 Standard drinks or equivalent per week    Comment: weekly    Drug use: No   Sexual activity: Not on file  Other Topics Concern  Not on file  Social History Narrative   Not on file   Social Determinants of Health   Financial Resource Strain: Not on file  Food Insecurity: Not on file  Transportation Needs: Not on file  Physical Activity: Not on file  Stress: Not on file  Social Connections: Not on file  Intimate Partner Violence: Not on file    Review of Systems  Constitutional:  Negative for chills and fever.  Respiratory:  Negative for shortness of breath.   Cardiovascular:  Negative for chest pain, palpitations and leg swelling.  Gastrointestinal:  Negative for abdominal pain, constipation, diarrhea, nausea and vomiting.  Genitourinary:  Negative for dysuria, frequency and urgency.  Neurological:  Negative for dizziness, weakness and headaches.  Psychiatric/Behavioral:  Negative for depression. The patient is not nervous/anxious.         Objective    BP 130/86 (BP Location: Left Arm, Patient  Position: Sitting, Cuff Size: Large)   Pulse 75   Temp 97.8 F (36.6 C) (Temporal)   Ht 5' 6.25" (1.683 m)   Wt 214 lb (97.1 kg)   SpO2 98%   BMI 34.28 kg/m   Physical Exam Constitutional:      General: He is not in acute distress.    Appearance: He is not ill-appearing.  Eyes:     Extraocular Movements: Extraocular movements intact.     Conjunctiva/sclera: Conjunctivae normal.  Cardiovascular:     Rate and Rhythm: Normal rate.  Pulmonary:     Effort: Pulmonary effort is normal.  Musculoskeletal:     Cervical back: Normal range of motion and neck supple.  Skin:    General: Skin is warm and dry.  Neurological:     General: No focal deficit present.     Mental Status: He is alert and oriented to person, place, and time.  Psychiatric:        Mood and Affect: Mood normal.        Behavior: Behavior normal.        Thought Content: Thought content normal.         Assessment & Plan:   Problem List Items Addressed This Visit       Respiratory   OSA (obstructive sleep apnea)    Recommend using CPAP regularly. I also recommend starting back on GLP-1, Zepbound for weight loss.       Relevant Medications   tirzepatide (ZEPBOUND) 2.5 MG/0.5ML Pen     Other   Family history of diabetes mellitus in brother   Mixed hyperlipidemia    Recommend a diet low in saturated fat and fried food.  Recommend getting adequate exercise.  Eat plenty of fiber.  Follow-up for fasting CPE      Relevant Medications   tirzepatide (ZEPBOUND) 2.5 MG/0.5ML Pen   Obesity (BMI 30-39.9) - Primary    He has lost and regained weight after being on Mounjaro and having to stop it.  He then tried Trulicity which did not work at all for him.  2 years ago he took Ozempic. He would now like to try Zepbound.  He does have comorbidities affecting his health including prediabetes and hyperlipidemia.  He has a family history of diabetes.  Zepbound prescribed.  Recommend he follow-up after being on the  medication for 4 to 8 weeks.      Relevant Medications   tirzepatide (ZEPBOUND) 2.5 MG/0.5ML Pen   Prediabetes    Recommend low sugar, low carbohydrate diet and increasing physical activity.  Hoping to restart  tirzepatide.  He has had success with tirzepatide in the past.      Relevant Medications   tirzepatide (ZEPBOUND) 2.5 MG/0.5ML Pen    Return for fasting CPE at their convenience.   Hetty Blend, NP-C

## 2022-07-13 NOTE — Assessment & Plan Note (Signed)
He has lost and regained weight after being on Mounjaro and having to stop it.  He then tried Trulicity which did not work at all for him.  2 years ago he took Ozempic. He would now like to try Zepbound.  He does have comorbidities affecting his health including prediabetes and hyperlipidemia.  He has a family history of diabetes.  Zepbound prescribed.  Recommend he follow-up after being on the medication for 4 to 8 weeks.

## 2022-07-13 NOTE — Assessment & Plan Note (Signed)
Recommend a diet low in saturated fat and fried food.  Recommend getting adequate exercise.  Eat plenty of fiber.  Follow-up for fasting CPE

## 2022-07-23 ENCOUNTER — Other Ambulatory Visit (HOSPITAL_COMMUNITY): Payer: Self-pay

## 2022-07-24 ENCOUNTER — Telehealth: Payer: Self-pay | Admitting: Family Medicine

## 2022-07-24 ENCOUNTER — Telehealth: Payer: Self-pay

## 2022-07-24 ENCOUNTER — Other Ambulatory Visit (HOSPITAL_COMMUNITY): Payer: Self-pay

## 2022-07-24 NOTE — Telephone Encounter (Signed)
Pharmacy Patient Advocate Encounter   Received notification that prior authorization for Zepbound 2.5mg /0.7ml is required/requested.   PA submitted to [facility] via CoverMyMeds Key or (Medicaid) confirmation # [id number] Status is waiting for clinical questions.

## 2022-07-24 NOTE — Telephone Encounter (Signed)
Pt would like a call back with update.

## 2022-07-24 NOTE — Telephone Encounter (Signed)
tirzepatide (ZEPBOUND) 2.5 MG/0.5ML Pen  Medication need to PA stating from the pharmacy.

## 2022-07-27 ENCOUNTER — Other Ambulatory Visit (HOSPITAL_COMMUNITY): Payer: Self-pay

## 2022-07-27 NOTE — Telephone Encounter (Signed)
Patient Advocate Encounter  Prior Authorization for Zepbound has been approved with [employer].    PA# [id number] Effective dates: 07/25/22 through 11/28/22  Per [facility] test claim, copay for 28 days supply is $24.99  Called and left a voicemail at [employer] to notify of the approval.   Approval letter indexed to chart.

## 2022-07-30 NOTE — Telephone Encounter (Signed)
Called and notified pt of approval. Pt verbalized understanding

## 2022-08-31 ENCOUNTER — Telehealth: Payer: Self-pay | Admitting: Family Medicine

## 2022-08-31 DIAGNOSIS — R7303 Prediabetes: Secondary | ICD-10-CM

## 2022-08-31 DIAGNOSIS — E669 Obesity, unspecified: Secondary | ICD-10-CM

## 2022-08-31 DIAGNOSIS — G4733 Obstructive sleep apnea (adult) (pediatric): Secondary | ICD-10-CM

## 2022-08-31 DIAGNOSIS — E782 Mixed hyperlipidemia: Secondary | ICD-10-CM

## 2022-08-31 NOTE — Telephone Encounter (Signed)
Patient has called wondering if he can pick up his tirzepatide (ZEPBOUND) 2.5 MG/0.5Pen and if the dosage has changed to 5MG , please advise    [id number] - [place], [place] - [address] AT [facility] OF [address]   Supposed to be picking up today and having the shot today

## 2022-09-03 MED ORDER — ZEPBOUND 2.5 MG/0.5ML ~~LOC~~ SOAJ: 2.5000 mg | SUBCUTANEOUS | 1 refills | Status: DC

## 2022-09-19 ENCOUNTER — Encounter: Payer: Self-pay | Admitting: Family Medicine

## 2022-09-19 ENCOUNTER — Ambulatory Visit: Payer: BC Managed Care – PPO | Admitting: Family Medicine

## 2022-09-19 VITALS — BP 132/86 | HR 91 | Temp 97.6°F | Ht 66.25 in | Wt 209.0 lb

## 2022-09-19 DIAGNOSIS — R7303 Prediabetes: Secondary | ICD-10-CM | POA: Diagnosis not present

## 2022-09-19 DIAGNOSIS — E7800 Pure hypercholesterolemia, unspecified: Secondary | ICD-10-CM | POA: Diagnosis not present

## 2022-09-19 DIAGNOSIS — Z125 Encounter for screening for malignant neoplasm of prostate: Secondary | ICD-10-CM

## 2022-09-19 DIAGNOSIS — E669 Obesity, unspecified: Secondary | ICD-10-CM

## 2022-09-19 DIAGNOSIS — Z0001 Encounter for general adult medical examination with abnormal findings: Secondary | ICD-10-CM

## 2022-09-19 MED ORDER — ZEPBOUND 5 MG/0.5ML ~~LOC~~ SOAJ
5.0000 mg | SUBCUTANEOUS | 2 refills | Status: DC
Start: 2022-09-19 — End: 2022-09-28

## 2022-09-19 NOTE — Patient Instructions (Signed)
Please return for fasting labs Friday morning.   I am increasing your weekly Zepbound dose to 5 mg.

## 2022-09-19 NOTE — Progress Notes (Signed)
Complete physical exam  Patient: Kyle Patton   DOB: 21-Mar-1975   47 y.o. Male  MRN: [id number]  Subjective:    Chief Complaint  Patient presents with   Annual Exam    Not fasting   He is here for a complete physical exam.  Weight is down 5 lbs since 07/13/2022 and restarting Zepbound.      Health Maintenance  Topic Date Due   COVID-19 Vaccine (3 - 2023-24 season) 10/05/2022*   DTaP/Tdap/Td vaccine (2 - Td or Tdap) 02/01/2028   Hepatitis C Screening  Completed   HIV Screening  Completed   HPV Vaccine  Aged Out   Flu Shot  Discontinued   Cologuard (Stool DNA test)  Discontinued  *Topic was postponed. The date shown is not the original due date.    Wears seatbelt always, uses sunscreen, smoke detectors in home and functioning, does not text while driving, feels safe in home environment.  Depression screening:    09/19/2022    3:06 PM 07/13/2022   10:52 AM 06/19/2021    8:39 AM  Depression screen PHQ 2/9  Decreased Interest 0 0 0  Down, Depressed, Hopeless 0 0 0  PHQ - 2 Score 0 0 0   Anxiety Screening:     No data to display          Vision:Within last year, Dental: No current dental problems and Receives regular dental care, and PSA: Agrees to PSA testing  Patient Active Problem List   Diagnosis Date Noted   Mixed hyperlipidemia 06/19/2021   Class 1 obesity with serious comorbidity and body mass index (BMI) of 32.0 to 32.9 in adult 06/19/2021   ED (erectile dysfunction) 06/02/2018   Nocturnal hypoxemia 03/12/2018   Prediabetes 02/19/2018   Pure hypercholesterolemia 02/19/2018   Family history of diabetes mellitus in brother 01/31/2018   Obesity (BMI 30-39.9) 01/31/2018   OSA (obstructive sleep apnea)    Gastroesophageal reflux disease    TB (tuberculosis), treated    Past Medical History:  Diagnosis Date   Acid reflux    Asthma    ED (erectile dysfunction) 06/02/2018   Elevated LDL cholesterol level 02/19/2018   Obesity (BMI 30-39.9) 01/31/2018    Sleep apnea    PT uses C=PAP   TB (tuberculosis), treated    History reviewed. No pertinent surgical history. Social History   Tobacco Use   Smoking status: Former    Current packs/day: 0.00    Average packs/day: 0.5 packs/day for 10.0 years (5.0 ttl pk-yrs)    Types: Cigarettes    Start date: 08/12/1997    Quit date: 08/13/2007    Years since quitting: 15.1   Smokeless tobacco: Never  Vaping Use   Vaping status: Never Used  Substance Use Topics   Alcohol use: Yes    Alcohol/week: 2.0 standard drinks of alcohol    Types: 2 Standard drinks or equivalent per week    Comment: weekly    Drug use: No      Patient Care Team: Avanell Shackleton, NP-C as PCP - General (Family Medicine)   Outpatient Medications Prior to Visit  Medication Sig   omeprazole (PRILOSEC) 20 MG capsule Take 20 mg by mouth daily.   [DISCONTINUED] tirzepatide (ZEPBOUND) 2.5 MG/0.5ML Pen Inject 2.5 mg into the skin once a week.   No facility-administered medications prior to visit.    Review of Systems  Constitutional:  Negative for chills and fever.  HENT:  Negative for congestion, ear pain, sinus  pain and sore throat.   Eyes:  Negative for blurred vision, double vision and pain.  Respiratory:  Negative for cough, shortness of breath and wheezing.   Cardiovascular:  Negative for chest pain, palpitations and leg swelling.  Gastrointestinal:  Negative for abdominal pain, constipation, diarrhea, nausea and vomiting.  Genitourinary:  Negative for dysuria, frequency and urgency.  Musculoskeletal:  Negative for back pain, joint pain and myalgias.  Skin:  Negative for rash.  Neurological:  Negative for dizziness, tingling, focal weakness and headaches.  Psychiatric/Behavioral:  Negative for depression. The patient is not nervous/anxious.        Objective:    BP 132/86 (BP Location: Left Arm, Patient Position: Sitting, Cuff Size: Large)   Pulse 91   Temp 97.6 F (36.4 C) (Temporal)   Ht 5' 6.25" (1.683 m)    Wt 209 lb (94.8 kg)   SpO2 97%   BMI 33.48 kg/m  BP Readings from Last 3 Encounters:  09/19/22 132/86  07/13/22 130/86  02/22/22 130/80   Wt Readings from Last 3 Encounters:  09/19/22 209 lb (94.8 kg)  07/13/22 214 lb (97.1 kg)  02/22/22 200 lb 3.2 oz (90.8 kg)    Physical Exam Constitutional:      General: He is not in acute distress.    Appearance: He is not ill-appearing.  HENT:     Right Ear: Tympanic membrane, ear canal and external ear normal.     Left Ear: Tympanic membrane, ear canal and external ear normal.     Nose: Nose normal.     Mouth/Throat:     Mouth: Mucous membranes are moist.     Pharynx: Oropharynx is clear.  Eyes:     Extraocular Movements: Extraocular movements intact.     Conjunctiva/sclera: Conjunctivae normal.     Pupils: Pupils are equal, round, and reactive to light.  Neck:     Thyroid: No thyroid mass, thyromegaly or thyroid tenderness.  Cardiovascular:     Rate and Rhythm: Normal rate and regular rhythm.     Pulses: Normal pulses.     Heart sounds: Normal heart sounds.  Pulmonary:     Effort: Pulmonary effort is normal.     Breath sounds: Normal breath sounds.  Abdominal:     General: Bowel sounds are normal. There is no distension.     Palpations: Abdomen is soft.     Tenderness: There is no abdominal tenderness. There is no right CVA tenderness, left CVA tenderness, guarding or rebound.  Musculoskeletal:        General: Normal range of motion.     Cervical back: Normal range of motion and neck supple. No tenderness.     Right lower leg: No edema.     Left lower leg: No edema.  Lymphadenopathy:     Cervical: No cervical adenopathy.  Skin:    General: Skin is warm and dry.     Findings: No lesion or rash.  Neurological:     General: No focal deficit present.     Mental Status: He is alert and oriented to person, place, and time.     Cranial Nerves: No cranial nerve deficit.     Sensory: No sensory deficit.     Motor: No  weakness.  Psychiatric:        Mood and Affect: Mood normal.        Behavior: Behavior normal.        Thought Content: Thought content normal.      No results  found for any visits on 09/19/22.    Assessment & Plan:    Routine Health Maintenance and Physical Exam  Problem List Items Addressed This Visit       Other   Obesity (BMI 30-39.9)   Relevant Medications   tirzepatide (ZEPBOUND) 5 MG/0.5ML Pen   Other Relevant Orders   CBC with Differential/Platelet   Comprehensive metabolic panel   TSH   Hemoglobin A1c   Lipid panel   Prediabetes   Relevant Orders   CBC with Differential/Platelet   Comprehensive metabolic panel   Hemoglobin A1c   Pure hypercholesterolemia   Relevant Orders   Lipid panel   Other Visit Diagnoses     Encounter for general adult medical examination with abnormal findings    -  Primary   Screening for prostate cancer       Relevant Orders   PSA      Preventive health care reviewed. PSA ordered. Counseling on healthy lifestyle including diet and exercise.  Recommend regular dental and eye exams.  Immunizations reviewed.  Discussed safety. Increase Zepbound dose.  Follow up for fasting labs in 2 days.  Follow up 3 months for obesity and Zepbound   Return in about 3 months (around 12/20/2022).     Hetty Blend, NP-C

## 2022-09-24 ENCOUNTER — Other Ambulatory Visit (INDEPENDENT_AMBULATORY_CARE_PROVIDER_SITE_OTHER): Payer: BC Managed Care – PPO

## 2022-09-24 DIAGNOSIS — R7303 Prediabetes: Secondary | ICD-10-CM | POA: Diagnosis not present

## 2022-09-24 DIAGNOSIS — E7800 Pure hypercholesterolemia, unspecified: Secondary | ICD-10-CM

## 2022-09-24 DIAGNOSIS — E669 Obesity, unspecified: Secondary | ICD-10-CM

## 2022-09-24 DIAGNOSIS — Z125 Encounter for screening for malignant neoplasm of prostate: Secondary | ICD-10-CM

## 2022-09-24 LAB — CBC WITH DIFFERENTIAL/PLATELET
Basophils Absolute: 0 10*3/uL (ref 0.0–0.1)
Basophils Relative: 0.6 % (ref 0.0–3.0)
Eosinophils Absolute: 0.1 10*3/uL (ref 0.0–0.7)
Eosinophils Relative: 1.5 % (ref 0.0–5.0)
HCT: 42.4 % (ref 39.0–52.0)
Hemoglobin: 14.2 g/dL (ref 13.0–17.0)
Lymphocytes Relative: 34.5 % (ref 12.0–46.0)
Lymphs Abs: 1.9 10*3/uL (ref 0.7–4.0)
MCHC: 33.4 g/dL (ref 30.0–36.0)
MCV: 94.7 fl (ref 78.0–100.0)
Monocytes Absolute: 0.5 10*3/uL (ref 0.1–1.0)
Monocytes Relative: 8.3 % (ref 3.0–12.0)
Neutro Abs: 3 10*3/uL (ref 1.4–7.7)
Neutrophils Relative %: 55.1 % (ref 43.0–77.0)
Platelets: 198 10*3/uL (ref 150.0–400.0)
RBC: 4.48 Mil/uL (ref 4.22–5.81)
RDW: 12.6 % (ref 11.5–15.5)
WBC: 5.4 10*3/uL (ref 4.0–10.5)

## 2022-09-24 LAB — COMPREHENSIVE METABOLIC PANEL
ALT: 16 U/L (ref 0–53)
AST: 14 U/L (ref 0–37)
Albumin: 4.7 g/dL (ref 3.5–5.2)
Alkaline Phosphatase: 77 U/L (ref 39–117)
BUN: 24 mg/dL — ABNORMAL HIGH (ref 6–23)
CO2: 26 mEq/L (ref 19–32)
Calcium: 9.5 mg/dL (ref 8.4–10.5)
Chloride: 104 mEq/L (ref 96–112)
Creatinine, Ser: 1.38 mg/dL (ref 0.40–1.50)
GFR: 60.76 mL/min (ref >60.00)
Glucose, Bld: 91 mg/dL (ref 70–99)
Potassium: 4.3 mEq/L (ref 3.5–5.1)
Sodium: 138 mEq/L (ref 135–145)
Total Bilirubin: 0.7 mg/dL (ref 0.2–1.2)
Total Protein: 7.1 g/dL (ref 6.0–8.3)

## 2022-09-24 LAB — LIPID PANEL
Cholesterol: 196 mg/dL (ref 0–200)
HDL: 51.8 mg/dL (ref >39.00)
LDL Cholesterol: 126 mg/dL — ABNORMAL HIGH (ref 0–99)
NonHDL: 144.27
Total CHOL/HDL Ratio: 4
Triglycerides: 90 mg/dL (ref 0.0–149.0)
VLDL: 18 mg/dL (ref 0.0–40.0)

## 2022-09-24 LAB — PSA: PSA: 0.43 ng/mL (ref 0.10–4.00)

## 2022-09-24 LAB — TSH: TSH: 1.81 u[IU]/mL (ref 0.35–5.50)

## 2022-09-24 LAB — HEMOGLOBIN A1C: Hgb A1c MFr Bld: 5.6 % (ref 4.6–6.5)

## 2022-09-28 ENCOUNTER — Telehealth: Payer: Self-pay | Admitting: Family Medicine

## 2022-09-28 DIAGNOSIS — E669 Obesity, unspecified: Secondary | ICD-10-CM

## 2022-09-28 MED ORDER — ZEPBOUND 5 MG/0.5ML ~~LOC~~ SOAJ: 5.0000 mg | SUBCUTANEOUS | 2 refills | Status: DC

## 2022-09-28 NOTE — Telephone Encounter (Signed)
Rx resent to other pharmacy .

## 2022-09-28 NOTE — Telephone Encounter (Signed)
Patient said the pharmacy is saying they didn't get the prescription for tirzepatide (ZEPBOUND) 5 MG/0.5ML Pen that was sent in. He would like to know if it can be changed to [facility] on [address] in [place]. Patient would like a call back at [phone].

## 2022-11-16 ENCOUNTER — Telehealth: Payer: Self-pay

## 2022-11-16 ENCOUNTER — Telehealth: Payer: Self-pay | Admitting: Family Medicine

## 2022-11-16 MED ORDER — ZEPBOUND 7.5 MG/0.5ML ~~LOC~~ SOAJ: 7.5000 mg | SUBCUTANEOUS | 0 refills | Status: DC

## 2022-11-16 NOTE — Telephone Encounter (Signed)
New dosage sent

## 2022-11-16 NOTE — Telephone Encounter (Signed)
PA started for Zepbound 7.5 mg  Key: [id number]  Pt would like to be notified via when response is received, will need 3 month f/u w La[facility]

## 2022-11-16 NOTE — Telephone Encounter (Signed)
Pt would like to increase zepbound dose, are you ok with this or will he need ov?

## 2022-11-16 NOTE — Addendum Note (Signed)
Addended by: Marinus Maw on: 11/16/2022 03:26 PM   Modules accepted: Orders

## 2022-11-16 NOTE — Telephone Encounter (Signed)
Patient is wanting to increase his dosage of zepbound.  Please call patient to discuss this.  [phone]

## 2022-11-23 ENCOUNTER — Telehealth: Payer: Self-pay

## 2022-11-23 NOTE — Telephone Encounter (Signed)
Zepbound 7.5 mg denied due to needing to lose 5% body fat, how would you like to move forward?

## 2022-11-23 NOTE — Telephone Encounter (Signed)
Pharmacy Patient Advocate Encounter  Received notification from [facility] that Prior Authorization for Zepbound 7.5MG /0.5ML Soln Auto-inj has been DENIED.  Full denial letter will be uploaded to the media tab. See denial reason below.  This medication is covered when the member has lost at least 5% of their starting body weight (the member's weight before using this medication). In this case, it is unknown if member has lost 5% or more of their starting body weight. Please note: Requests must include the member's weight before starting this medication and the member's current weight, along with the dates these weights were taken.  Please be advised we currently do not have a Pharmacist to review denials, therefore you will need to process appeals accordingly as needed. Thanks for your support at this time. Contact for appeals are as follows: Phone: [phone], Fax: [phone]

## 2022-11-27 MED ORDER — ZEPBOUND 5 MG/0.5ML ~~LOC~~ SOAJ: 5.0000 mg | SUBCUTANEOUS | 0 refills | Status: DC

## 2022-11-27 NOTE — Addendum Note (Signed)
Addended by: Marinus Maw on: 11/27/2022 10:25 AM   Modules accepted: Orders

## 2022-12-17 ENCOUNTER — Other Ambulatory Visit: Payer: Self-pay | Admitting: Family Medicine

## 2022-12-27 ENCOUNTER — Ambulatory Visit: Payer: BLUE CROSS/BLUE SHIELD | Admitting: Family Medicine

## 2022-12-28 ENCOUNTER — Ambulatory Visit: Payer: BC Managed Care – PPO | Admitting: Family Medicine

## 2022-12-28 ENCOUNTER — Telehealth: Payer: Self-pay | Admitting: Radiology

## 2022-12-28 ENCOUNTER — Encounter: Payer: Self-pay | Admitting: Family Medicine

## 2022-12-28 ENCOUNTER — Telehealth: Payer: Self-pay | Admitting: Family Medicine

## 2022-12-28 VITALS — BP 124/88 | HR 77 | Temp 98.3°F | Ht 66.25 in | Wt 196.0 lb

## 2022-12-28 DIAGNOSIS — E782 Mixed hyperlipidemia: Secondary | ICD-10-CM

## 2022-12-28 DIAGNOSIS — E669 Obesity, unspecified: Secondary | ICD-10-CM

## 2022-12-28 MED ORDER — ZEPBOUND 7.5 MG/0.5ML ~~LOC~~ SOAJ: 7.5000 mg | SUBCUTANEOUS | 0 refills | Status: DC

## 2022-12-28 NOTE — Telephone Encounter (Signed)
error 

## 2022-12-28 NOTE — Progress Notes (Signed)
Subjective:     Patient ID: Kyle Patton, male    DOB: 04-Jul-1975, 47 y.o.   MRN: [id number]  Chief Complaint  Patient presents with   Medication Refill    Patient states wanting to go up in dosage on the Zepbound.     Medication Refill Pertinent negatives include no abdominal pain, chest pain, chills, fever, nausea or vomiting.     History of Present Illness          He is here to follow up on obesity and Zepbound.  Doing well. Continuing to lose weight but recently his cravings have been returning later in the week after his injection. Would like to increase his dose.     There are no preventive care reminders to display for this patient.  Past Medical History:  Diagnosis Date   Acid reflux    Asthma    ED (erectile dysfunction) 06/02/2018   Elevated LDL cholesterol level 02/19/2018   Obesity (BMI 30-39.9) 01/31/2018   Sleep apnea    PT uses C=PAP   TB (tuberculosis), treated     History reviewed. No pertinent surgical history.  Family History  Problem Relation Age of Onset   Breast cancer Mother    Hypertension Mother    Hyperlipidemia Mother    Diabetes Brother    Diabetes Maternal Grandmother    Colon cancer Neg Hx    Prostate cancer Neg Hx    Heart attack Neg Hx    CVA Neg Hx     Social History   Socioeconomic History   Marital status: Married    Spouse name: Not on file   Number of children: Not on file   Years of education: Not on file   Highest education level: Not on file  Occupational History   Not on file  Tobacco Use   Smoking status: Former    Current packs/day: 0.00    Average packs/day: 0.5 packs/day for 10.0 years (5.0 ttl pk-yrs)    Types: Cigarettes    Start date: 08/12/1997    Quit date: 08/13/2007    Years since quitting: 15.3   Smokeless tobacco: Never  Vaping Use   Vaping status: Never Used  Substance and Sexual Activity   Alcohol use: Yes    Alcohol/week: 2.0 standard drinks of alcohol    Types: 2 Standard drinks or  equivalent per week    Comment: weekly    Drug use: No   Sexual activity: Not on file  Other Topics Concern   Not on file  Social History Narrative   Not on file   Social Determinants of Health   Financial Resource Strain: Not on file  Food Insecurity: Not on file  Transportation Needs: Not on file  Physical Activity: Not on file  Stress: Not on file  Social Connections: Not on file  Intimate Partner Violence: Not on file    Outpatient Medications Prior to Visit  Medication Sig Dispense Refill   omeprazole (PRILOSEC) 20 MG capsule Take 20 mg by mouth daily.     tirzepatide (ZEPBOUND) 5 MG/0.5ML Pen ADMINISTER 5 MG UNDER THE SKIN 1 TIME A WEEK 2 mL 1   tirzepatide (ZEPBOUND) 7.5 MG/0.5ML Pen Inject 7.5 mg into the skin once a week. (Patient not taking: Reported on 12/28/2022) 6 mL 0   No facility-administered medications prior to visit.    No Known Allergies  Review of Systems  Constitutional:  Positive for weight loss. Negative for chills and fever.  Intentional   Respiratory:  Negative for shortness of breath.   Cardiovascular:  Negative for chest pain, palpitations and leg swelling.  Gastrointestinal:  Negative for abdominal pain, constipation, diarrhea, nausea and vomiting.  Genitourinary:  Negative for dysuria, frequency and urgency.  Neurological:  Negative for dizziness and focal weakness.  Psychiatric/Behavioral:  Negative for depression. The patient is not nervous/anxious.        Objective:    Physical Exam Constitutional:      General: He is not in acute distress.    Appearance: He is not ill-appearing.  Eyes:     Extraocular Movements: Extraocular movements intact.     Conjunctiva/sclera: Conjunctivae normal.  Cardiovascular:     Rate and Rhythm: Normal rate.  Pulmonary:     Effort: Pulmonary effort is normal.  Musculoskeletal:     Cervical back: Normal range of motion and neck supple.  Skin:    General: Skin is warm and dry.  Neurological:      General: No focal deficit present.     Mental Status: He is alert and oriented to person, place, and time.  Psychiatric:        Mood and Affect: Mood normal.        Behavior: Behavior normal.        Thought Content: Thought content normal.      BP 124/88 (BP Location: Left Arm, Patient Position: Sitting, Cuff Size: Normal)   Pulse 77   Temp 98.3 F (36.8 C) (Oral)   Ht 5' 6.25" (1.683 m)   Wt 196 lb (88.9 kg)   SpO2 98%   BMI 31.40 kg/m  Wt Readings from Last 3 Encounters:  12/28/22 196 lb (88.9 kg)  09/19/22 209 lb (94.8 kg)  07/13/22 214 lb (97.1 kg)       Assessment & Plan:   Problem List Items Addressed This Visit     Mixed hyperlipidemia   Obesity (BMI 30-39.9) - Primary   Doing well on Zepbound. Seeing weight loss.  Increase to 7.5 mg weekly.  Follow up for fasting labs and ov in 3 months.   I am having Rene Kocher maintain his omeprazole, Zepbound, and Zepbound.  No orders of the defined types were placed in this encounter.

## 2022-12-28 NOTE — Addendum Note (Signed)
Addended by: Aundra Millet on: 12/28/2022 04:16 PM   Modules accepted: Orders

## 2022-12-28 NOTE — Patient Instructions (Signed)
Ok to increase to 7.5 mg once weekly.  Keep up the good work.

## 2022-12-31 ENCOUNTER — Telehealth: Payer: Self-pay

## 2022-12-31 ENCOUNTER — Other Ambulatory Visit (HOSPITAL_COMMUNITY): Payer: Self-pay

## 2022-12-31 NOTE — Telephone Encounter (Signed)
A user error has taken place: encounter opened in error, closed for administrative reasons.

## 2022-12-31 NOTE — Telephone Encounter (Signed)
Pharmacy Patient Advocate Encounter   Received notification from Pt Calls Messages that prior authorization for Zepbound 0.75mg /0.57ml is required/requested.   Insurance verification completed.   The patient is insured through [facility] .   Per test claim: PA required; PA started via CoverMyMeds. KEY [id number] . Waiting for clinical questions to populate.

## 2023-01-01 NOTE — Telephone Encounter (Signed)
 Clinical questions answered and PA submitted

## 2023-01-02 ENCOUNTER — Other Ambulatory Visit (HOSPITAL_COMMUNITY): Payer: Self-pay

## 2023-01-03 ENCOUNTER — Other Ambulatory Visit (HOSPITAL_COMMUNITY): Payer: Self-pay

## 2023-01-03 NOTE — Telephone Encounter (Signed)
Called to check the status of the prior authorization at [phone]. Per the representative, status is pending and we should hear something by the end of day on 01/08/23.

## 2023-01-09 ENCOUNTER — Other Ambulatory Visit (HOSPITAL_COMMUNITY): Payer: Self-pay

## 2023-01-09 NOTE — Telephone Encounter (Signed)
Pharmacy Patient Advocate Encounter  Received notification from [facility] that Prior Authorization for Zepbound 7.5mg /0.59ml has been APPROVED from 11/19/22 to 11/19/23   PA #/Case ID/Reference #: [id number]  Left a voicemail with [facility] to notify of the approval.

## 2023-01-21 ENCOUNTER — Ambulatory Visit: Payer: BC Managed Care – PPO | Admitting: Family Medicine

## 2023-04-02 ENCOUNTER — Other Ambulatory Visit: Payer: Self-pay | Admitting: Family Medicine

## 2023-04-02 ENCOUNTER — Ambulatory Visit: Payer: BLUE CROSS/BLUE SHIELD | Admitting: Family Medicine

## 2023-04-02 DIAGNOSIS — E782 Mixed hyperlipidemia: Secondary | ICD-10-CM

## 2023-05-14 ENCOUNTER — Telehealth: Payer: Self-pay | Admitting: Family Medicine

## 2023-05-14 DIAGNOSIS — E782 Mixed hyperlipidemia: Secondary | ICD-10-CM

## 2023-05-14 MED ORDER — ZEPBOUND 7.5 MG/0.5ML ~~LOC~~ SOAJ: 7.5000 mg | SUBCUTANEOUS | 0 refills | Status: AC

## 2023-05-14 NOTE — Telephone Encounter (Signed)
 Copied from CRM [id number]. Topic: Clinical - Prescription Issue >> May 13, 2023  5:13 PM Denese Killings wrote: Reason for CRM: Patient picked up one box ZEPBOUND 7.5 MG/0.5ML Pen from [facility] and didn't get the other 2 boxes. He wants the other 2 boxes sent if not send increased dosage of 10 to [facility]. He states that the his insurance is going to end at Midnight and wants to know if it can be approved tonight. Insurance is switching, effective tomorrow. Patient is requesting a callback as soon as possible.

## 2023-05-14 NOTE — Telephone Encounter (Signed)
 Called pt for clarification as last rx was sent to [facility]. Pt states he does want it to go to [facility], refill sent to [facility] and was able to book overdue f/u appt

## 2023-05-15 ENCOUNTER — Telehealth: Payer: Self-pay | Admitting: Pharmacy Technician

## 2023-05-15 ENCOUNTER — Other Ambulatory Visit (HOSPITAL_COMMUNITY): Payer: Self-pay

## 2023-05-15 NOTE — Telephone Encounter (Signed)
 Pharmacy Patient Advocate Encounter   Received notification from CoverMyMeds that prior authorization for Zepbound 7.5MG /0.5ML pen-injectors is required/requested.   Insurance verification completed.   The patient is insured through [facility] .   Per test claim:

## 2023-05-22 ENCOUNTER — Other Ambulatory Visit (HOSPITAL_COMMUNITY): Payer: Self-pay

## 2023-05-22 ENCOUNTER — Other Ambulatory Visit: Payer: Self-pay | Admitting: Family Medicine

## 2023-05-22 DIAGNOSIS — G4733 Obstructive sleep apnea (adult) (pediatric): Secondary | ICD-10-CM

## 2023-05-22 DIAGNOSIS — E669 Obesity, unspecified: Secondary | ICD-10-CM

## 2023-05-22 DIAGNOSIS — R7303 Prediabetes: Secondary | ICD-10-CM

## 2023-05-22 DIAGNOSIS — E782 Mixed hyperlipidemia: Secondary | ICD-10-CM

## 2023-05-22 NOTE — Telephone Encounter (Signed)
 Copied from CRM [id number]. Topic: Clinical - Medication Question >> May 22, 2023  9:27 AM [name] wrote: Reason for CRM: patient called to inquire about tirzepatide (ZEPBOUND) 7.5 MG/0.5ML Pen medication that was prescribed. Patient states he only received 1 out of 3 doses of his medication. Prior authorization located in the system dated for 05/15/23. Member also stated that he recently changed insurances and pharmacy and would like a call back with clarification on this medication change.

## 2023-05-22 NOTE — Telephone Encounter (Signed)
 Called pt and informed him we did send in 3 month supply so not sure why [facility] sent only 1 box. Advised his new insurance does not cover this so that might be why, pt seemed confused as he thought since he put he order in right before the insurance changed it wouldn't matter, advised him to call [facility] and his insurance for clarity as we did send in the 6 mL.

## 2023-05-23 ENCOUNTER — Encounter: Payer: Self-pay | Admitting: Family Medicine

## 2023-05-23 ENCOUNTER — Ambulatory Visit: Admitting: Family Medicine

## 2023-05-23 VITALS — BP 122/78 | HR 70 | Temp 98.0°F | Ht 66.25 in | Wt 188.0 lb

## 2023-05-23 DIAGNOSIS — Z6830 Body mass index (BMI) 30.0-30.9, adult: Secondary | ICD-10-CM

## 2023-05-23 DIAGNOSIS — E669 Obesity, unspecified: Secondary | ICD-10-CM

## 2023-05-23 NOTE — Progress Notes (Signed)
 Subjective:     Patient ID: Kyle Patton, male    DOB: 1976-01-11, 48 y.o.   MRN: [id number]  Chief Complaint  Patient presents with   Medical Management of Chronic Issues    6 months f/u     HPI  History of Present Illness         Here to follow-up on weight loss and medication management.   He has been doing well on Zepbound.  He has been on 7.5 mg weekly since November 2024. He has lost an additional 8 pounds.  Lost a total of approximately 60 pounds since being on Zepbound.  He is eating a healthy diet and exercising.  States his new insurance is no longer going to cover the medication.   LDL 126 in August 2024.  History of prediabetes with his last A1c 5.6%     Health Maintenance Due  Topic Date Due   COVID-19 Vaccine (3 - 2024-25 season) 10/14/2022    Past Medical History:  Diagnosis Date   Acid reflux    Asthma    ED (erectile dysfunction) 06/02/2018   Elevated LDL cholesterol level 02/19/2018   Obesity (BMI 30-39.9) 01/31/2018   Sleep apnea    PT uses C=PAP   TB (tuberculosis), treated     History reviewed. No pertinent surgical history.  Family History  Problem Relation Age of Onset   Breast cancer Mother    Hypertension Mother    Hyperlipidemia Mother    Diabetes Brother    Diabetes Maternal Grandmother    Colon cancer Neg Hx    Prostate cancer Neg Hx    Heart attack Neg Hx    CVA Neg Hx     Social History   Socioeconomic History   Marital status: Married    Spouse name: Not on file   Number of children: Not on file   Years of education: Not on file   Highest education level: Not on file  Occupational History   Not on file  Tobacco Use   Smoking status: Former    Current packs/day: 0.00    Average packs/day: 0.5 packs/day for 10.0 years (5.0 ttl pk-yrs)    Types: Cigarettes    Start date: 08/12/1997    Quit date: 08/13/2007    Years since quitting: 15.7   Smokeless tobacco: Never  Vaping Use   Vaping status: Never Used   Substance and Sexual Activity   Alcohol use: Yes    Alcohol/week: 2.0 standard drinks of alcohol    Types: 2 Standard drinks or equivalent per week    Comment: weekly    Drug use: No   Sexual activity: Not on file  Other Topics Concern   Not on file  Social History Narrative   Not on file   Social Drivers of Health   Financial Resource Strain: Not on file  Food Insecurity: Not on file  Transportation Needs: Not on file  Physical Activity: Not on file  Stress: Not on file  Social Connections: Not on file  Intimate Partner Violence: Not on file    Outpatient Medications Prior to Visit  Medication Sig Dispense Refill   omeprazole (PRILOSEC) 20 MG capsule Take 20 mg by mouth daily.     tirzepatide (ZEPBOUND) 7.5 MG/0.5ML Pen Inject 7.5 mg into the skin once a week. 6 mL 0   No facility-administered medications prior to visit.    No Known Allergies  Review of Systems  Constitutional:  Negative for chills and  fever.  Respiratory:  Negative for shortness of breath.   Cardiovascular:  Negative for chest pain, palpitations and leg swelling.  Gastrointestinal:  Negative for abdominal pain, constipation, diarrhea, nausea and vomiting.  Genitourinary:  Negative for dysuria, frequency and urgency.  Neurological:  Negative for dizziness.       Objective:    Physical Exam Constitutional:      General: He is not in acute distress.    Appearance: He is not ill-appearing.  Eyes:     Extraocular Movements: Extraocular movements intact.     Conjunctiva/sclera: Conjunctivae normal.  Cardiovascular:     Rate and Rhythm: Normal rate.  Pulmonary:     Effort: Pulmonary effort is normal.  Musculoskeletal:     Cervical back: Normal range of motion and neck supple.  Skin:    General: Skin is warm and dry.  Neurological:     General: No focal deficit present.     Mental Status: He is alert and oriented to person, place, and time.  Psychiatric:        Mood and Affect: Mood normal.         Behavior: Behavior normal.        Thought Content: Thought content normal.      BP 122/78 (BP Location: Left Arm, Patient Position: Sitting, Cuff Size: Normal)   Pulse 70   Temp 98 F (36.7 C) (Temporal)   Ht 5' 6.25" (1.683 m)   Wt 188 lb (85.3 kg)   SpO2 98%   BMI 30.12 kg/m  Wt Readings from Last 3 Encounters:  05/23/23 188 lb (85.3 kg)  12/28/22 196 lb (88.9 kg)  09/19/22 209 lb (94.8 kg)       Assessment & Plan:   Problem List Items Addressed This Visit     Obesity (BMI 30-39.9) - Primary   He has been quite successful on Zepbound is lost approximately 60 pounds.  He has been eating a healthy diet and exercising.  Overall, Zepbound has helped with his overall health including prediabetes, hyperlipidemia, sleep apnea and his mood.  He will let me know if you would like refills going forward.  He will follow-up in August for his fasting CPE.  I am having Rene Kocher maintain his omeprazole and Zepbound.  No orders of the defined types were placed in this encounter.

## 2023-10-02 ENCOUNTER — Ambulatory Visit: Admitting: Family Medicine

## 2023-10-02 ENCOUNTER — Ambulatory Visit: Payer: Self-pay | Admitting: Family Medicine

## 2023-10-02 ENCOUNTER — Encounter: Payer: Self-pay | Admitting: Family Medicine

## 2023-10-02 VITALS — BP 120/86 | HR 70 | Temp 97.8°F | Ht 66.25 in | Wt 196.0 lb

## 2023-10-02 DIAGNOSIS — Z125 Encounter for screening for malignant neoplasm of prostate: Secondary | ICD-10-CM

## 2023-10-02 DIAGNOSIS — R7303 Prediabetes: Secondary | ICD-10-CM | POA: Diagnosis not present

## 2023-10-02 DIAGNOSIS — G4733 Obstructive sleep apnea (adult) (pediatric): Secondary | ICD-10-CM

## 2023-10-02 DIAGNOSIS — E669 Obesity, unspecified: Secondary | ICD-10-CM | POA: Diagnosis not present

## 2023-10-02 DIAGNOSIS — E7800 Pure hypercholesterolemia, unspecified: Secondary | ICD-10-CM

## 2023-10-02 DIAGNOSIS — Z0001 Encounter for general adult medical examination with abnormal findings: Secondary | ICD-10-CM | POA: Diagnosis not present

## 2023-10-02 DIAGNOSIS — Z1211 Encounter for screening for malignant neoplasm of colon: Secondary | ICD-10-CM

## 2023-10-02 LAB — COMPREHENSIVE METABOLIC PANEL WITH GFR
ALT: 15 U/L (ref 0–53)
AST: 15 U/L (ref 0–37)
Albumin: 4.5 g/dL (ref 3.5–5.2)
Alkaline Phosphatase: 79 U/L (ref 39–117)
BUN: 13 mg/dL (ref 6–23)
CO2: 30 meq/L (ref 19–32)
Calcium: 9.1 mg/dL (ref 8.4–10.5)
Chloride: 105 meq/L (ref 96–112)
Creatinine, Ser: 1.06 mg/dL (ref 0.40–1.50)
GFR: 82.79 mL/min (ref >60.00)
Glucose, Bld: 91 mg/dL (ref 70–99)
Potassium: 4.1 meq/L (ref 3.5–5.1)
Sodium: 141 meq/L (ref 135–145)
Total Bilirubin: 0.6 mg/dL (ref 0.2–1.2)
Total Protein: 6.9 g/dL (ref 6.0–8.3)

## 2023-10-02 LAB — LIPID PANEL
Cholesterol: 190 mg/dL (ref 0–200)
HDL: 58.6 mg/dL (ref >39.00)
LDL Cholesterol: 117 mg/dL — ABNORMAL HIGH (ref 0–99)
NonHDL: 131.68
Total CHOL/HDL Ratio: 3
Triglycerides: 75 mg/dL (ref 0.0–149.0)
VLDL: 15 mg/dL (ref 0.0–40.0)

## 2023-10-02 LAB — CBC WITH DIFFERENTIAL/PLATELET
Basophils Absolute: 0 K/uL (ref 0.0–0.1)
Basophils Relative: 0.9 % (ref 0.0–3.0)
Eosinophils Absolute: 0.1 K/uL (ref 0.0–0.7)
Eosinophils Relative: 2.1 % (ref 0.0–5.0)
HCT: 40.1 % (ref 39.0–52.0)
Hemoglobin: 13.4 g/dL (ref 13.0–17.0)
Lymphocytes Relative: 39.9 % (ref 12.0–46.0)
Lymphs Abs: 1.9 K/uL (ref 0.7–4.0)
MCHC: 33.5 g/dL (ref 30.0–36.0)
MCV: 93.1 fl (ref 78.0–100.0)
Monocytes Absolute: 0.4 K/uL (ref 0.1–1.0)
Monocytes Relative: 9.3 % (ref 3.0–12.0)
Neutro Abs: 2.2 K/uL (ref 1.4–7.7)
Neutrophils Relative %: 47.8 % (ref 43.0–77.0)
Platelets: 197 K/uL (ref 150.0–400.0)
RBC: 4.31 Mil/uL (ref 4.22–5.81)
RDW: 12.9 % (ref 11.5–15.5)
WBC: 4.6 K/uL (ref 4.0–10.5)

## 2023-10-02 LAB — TSH: TSH: 1.33 u[IU]/mL (ref 0.35–5.50)

## 2023-10-02 LAB — PSA: PSA: 0.45 ng/mL (ref 0.10–4.00)

## 2023-10-02 LAB — T4, FREE: Free T4: 0.88 ng/dL (ref 0.60–1.60)

## 2023-10-02 LAB — HEMOGLOBIN A1C: Hgb A1c MFr Bld: 5.8 % (ref 4.6–6.5)

## 2023-10-02 NOTE — Progress Notes (Signed)
 Complete physical exam  Patient: Kyle Patton   DOB: 1975/09/03   48 y.o. Male  MRN: [id number]  Subjective:    Chief Complaint  Patient presents with   Annual Exam    fasting   He is here for a complete physical exam.    Health Maintenance  Topic Date Due   Hepatitis B Vaccine (1 of 3 - 19+ 3-dose series) Never done   COVID-19 Vaccine (3 - 2024-25 season) 10/18/2023*   DTaP/Tdap/Td vaccine (2 - Td or Tdap) 02/01/2028   Hepatitis C Screening  Completed   HIV Screening  Completed   Pneumococcal Vaccine  Aged Out   HPV Vaccine  Aged Out   Meningitis B Vaccine  Aged Out   Flu Shot  Discontinued   Cologuard (Stool DNA test)  Discontinued  *Topic was postponed. The date shown is not the original due date.    Wears seatbelt always, smoke detectors in home and functioning, does not text while driving, feels safe in home environment.  Depression screening:    10/02/2023   10:39 AM 12/28/2022   11:24 AM 09/19/2022    3:06 PM  Depression screen PHQ 2/9  Decreased Interest 0 0 0  Down, Depressed, Hopeless 0 0 0  PHQ - 2 Score 0 0 0   Anxiety Screening:     No data to display           Patient Care Team: Lendia Boby CROME, NP-C as PCP - General (Family Medicine)   Outpatient Medications Prior to Visit  Medication Sig   tirzepatide  (ZEPBOUND ) 7.5 MG/0.5ML Pen Inject 7.5 mg into the skin once a week.   [DISCONTINUED] omeprazole  (PRILOSEC) 20 MG capsule Take 20 mg by mouth daily.   No facility-administered medications prior to visit.    Review of Systems  Constitutional:  Negative for chills, fever, malaise/fatigue and weight loss.  HENT:  Negative for congestion, ear pain, sinus pain and sore throat.   Eyes:  Negative for blurred vision, double vision and pain.  Respiratory:  Negative for cough, shortness of breath and wheezing.   Cardiovascular:  Negative for chest pain, palpitations and leg swelling.  Gastrointestinal:  Negative for abdominal pain,  constipation, diarrhea, nausea and vomiting.  Genitourinary:  Negative for dysuria, frequency and urgency.  Musculoskeletal:  Negative for back pain, joint pain and myalgias.  Skin:  Negative for rash.  Neurological:  Negative for dizziness, tingling, focal weakness and headaches.  Psychiatric/Behavioral:  Negative for depression. The patient is not nervous/anxious.        Objective:    BP 120/86   Pulse 70   Temp 97.8 F (36.6 C) (Temporal)   Ht 5' 6.25 (1.683 m)   Wt 196 lb (88.9 kg)   SpO2 98%   BMI 31.40 kg/m  BP Readings from Last 3 Encounters:  10/02/23 120/86  05/23/23 122/78  12/28/22 124/88   Wt Readings from Last 3 Encounters:  10/02/23 196 lb (88.9 kg)  05/23/23 188 lb (85.3 kg)  12/28/22 196 lb (88.9 kg)    Physical Exam Constitutional:      General: He is not in acute distress.    Appearance: He is not ill-appearing.  HENT:     Right Ear: Tympanic membrane, ear canal and external ear normal.     Left Ear: Tympanic membrane, ear canal and external ear normal.     Nose: Nose normal.     Mouth/Throat:     Mouth: Mucous membranes are moist.  Pharynx: Oropharynx is clear.  Eyes:     Extraocular Movements: Extraocular movements intact.     Conjunctiva/sclera: Conjunctivae normal.     Pupils: Pupils are equal, round, and reactive to light.  Neck:     Thyroid: No thyroid mass, thyromegaly or thyroid tenderness.  Cardiovascular:     Rate and Rhythm: Normal rate and regular rhythm.     Pulses: Normal pulses.     Heart sounds: Normal heart sounds.  Pulmonary:     Effort: Pulmonary effort is normal.     Breath sounds: Normal breath sounds.  Abdominal:     General: Bowel sounds are normal. There is no distension.     Palpations: Abdomen is soft.     Tenderness: There is no abdominal tenderness. There is no right CVA tenderness, left CVA tenderness, guarding or rebound.  Musculoskeletal:        General: Normal range of motion.     Cervical back: Normal  range of motion and neck supple. No tenderness.     Right lower leg: No edema.     Left lower leg: No edema.  Lymphadenopathy:     Cervical: No cervical adenopathy.  Skin:    General: Skin is warm and dry.     Findings: No lesion or rash.  Neurological:     General: No focal deficit present.     Mental Status: He is alert and oriented to person, place, and time.     Cranial Nerves: No cranial nerve deficit.     Sensory: No sensory deficit.     Motor: No weakness.  Psychiatric:        Mood and Affect: Mood normal.        Behavior: Behavior normal.        Thought Content: Thought content normal.      Results for orders placed or performed in visit on 10/02/23  PSA  Result Value Ref Range   PSA 0.45 0.10 - 4.00 ng/mL  T4, free  Result Value Ref Range   Free T4 0.88 0.60 - 1.60 ng/dL  TSH  Result Value Ref Range   TSH 1.33 0.35 - 5.50 uIU/mL  Lipid panel  Result Value Ref Range   Cholesterol 190 0 - 200 mg/dL   Triglycerides 24.9 0.0 - 149.0 mg/dL   HDL 41.39 >60.99 mg/dL   VLDL 84.9 0.0 - 59.9 mg/dL   LDL Cholesterol 882 (H) 0 - 99 mg/dL   Total CHOL/HDL Ratio 3    NonHDL 131.68   Hemoglobin A1c  Result Value Ref Range   Hgb A1c MFr Bld 5.8 4.6 - 6.5 %  Comprehensive metabolic panel with GFR  Result Value Ref Range   Sodium 141 135 - 145 mEq/L   Potassium 4.1 3.5 - 5.1 mEq/L   Chloride 105 96 - 112 mEq/L   CO2 30 19 - 32 mEq/L   Glucose, Bld 91 70 - 99 mg/dL   BUN 13 6 - 23 mg/dL   Creatinine, Ser 8.93 0.40 - 1.50 mg/dL   Total Bilirubin 0.6 0.2 - 1.2 mg/dL   Alkaline Phosphatase 79 39 - 117 U/L   AST 15 0 - 37 U/L   ALT 15 0 - 53 U/L   Total Protein 6.9 6.0 - 8.3 g/dL   Albumin 4.5 3.5 - 5.2 g/dL   GFR 17.20 >39.99 mL/min   Calcium 9.1 8.4 - 10.5 mg/dL  CBC with Differential/Platelet  Result Value Ref Range   WBC 4.6 4.0 -  10.5 K/uL   RBC 4.31 4.22 - 5.81 Mil/uL   Hemoglobin 13.4 13.0 - 17.0 g/dL   HCT 59.8 60.9 - 47.9 %   MCV 93.1 78.0 - 100.0 fl    MCHC 33.5 30.0 - 36.0 g/dL   RDW 87.0 88.4 - 84.4 %   Platelets 197.0 150.0 - 400.0 K/uL   Neutrophils Relative % 47.8 43.0 - 77.0 %   Lymphocytes Relative 39.9 12.0 - 46.0 %   Monocytes Relative 9.3 3.0 - 12.0 %   Eosinophils Relative 2.1 0.0 - 5.0 %   Basophils Relative 0.9 0.0 - 3.0 %   Neutro Abs 2.2 1.4 - 7.7 K/uL   Lymphs Abs 1.9 0.7 - 4.0 K/uL   Monocytes Absolute 0.4 0.1 - 1.0 K/uL   Eosinophils Absolute 0.1 0.0 - 0.7 K/uL   Basophils Absolute 0.0 0.0 - 0.1 K/uL      Assessment & Plan:    Routine Health Maintenance and Physical Exam  Problem List Items Addressed This Visit     Obesity (BMI 30-39.9)   Relevant Orders   CBC with Differential/Platelet (Completed)   Comprehensive metabolic panel with GFR (Completed)   Hemoglobin A1c (Completed)   OSA (obstructive sleep apnea)   Prediabetes   Relevant Orders   CBC with Differential/Platelet (Completed)   Comprehensive metabolic panel with GFR (Completed)   Hemoglobin A1c (Completed)   TSH (Completed)   T4, free (Completed)   Pure hypercholesterolemia   Relevant Orders   Lipid panel (Completed)   Other Visit Diagnoses       Encounter for general adult medical examination with abnormal findings    -  Primary     Screen for colon cancer       Relevant Orders   Ambulatory referral to Gastroenterology     Screening for prostate cancer       Relevant Orders   PSA (Completed)       Assessment and Plan Assessment & Plan Adult Wellness Visit Routine adult wellness visit conducted. Prostate blood test was normal last year. Colonoscopy referral discussed, including procedure details, preparation, and potential outcomes. Consent obtained for colonoscopy referral. - Order prostate blood test - Refer to GI for colonoscopy - Order labs for routine screening  Obesity Obesity management discussed. He has been off Tirzepatide  (Zepbound ) since May due to insurance coverage issues, leading to weight gain. Emphasized the  importance of maintaining lifestyle changes initiated while on medication. Noted that most people regain some weight after stopping medication. - Check labs to assess current status without medication - Discuss alternative options for obtaining Tirzepatide   Prediabetes Prediabetes status to be monitored. Previous labs were normal. Current labs will be checked to assess status without Tirzepatide . - Order labs to monitor prediabetes status  Pure hypercholesterolemia Cholesterol levels to be monitored as part of routine screening. Previous levels were elevated. - Order labs to monitor cholesterol levels    Return in about 1 year (around 10/01/2024).     Boby Mackintosh, NP-C

## 2024-01-13 ENCOUNTER — Encounter: Payer: Self-pay | Admitting: Internal Medicine

## 2024-10-02 ENCOUNTER — Encounter: Admitting: Family Medicine
